# Patient Record
Sex: Female | Born: 1970
Health system: Southern US, Community
[De-identification: ages and names within clinical notes are randomized; demographics above are authoritative.]

## PROBLEM LIST (undated history)

## (undated) DIAGNOSIS — R42 Dizziness and giddiness: Secondary | ICD-10-CM

## (undated) DIAGNOSIS — D649 Anemia, unspecified: Secondary | ICD-10-CM

## (undated) DIAGNOSIS — J189 Pneumonia, unspecified organism: Secondary | ICD-10-CM

## (undated) DIAGNOSIS — R519 Headache, unspecified: Secondary | ICD-10-CM

## (undated) DIAGNOSIS — N941 Unspecified dyspareunia: Secondary | ICD-10-CM

## (undated) DIAGNOSIS — Z2239 Carrier of other specified bacterial diseases: Secondary | ICD-10-CM

## (undated) DIAGNOSIS — N898 Other specified noninflammatory disorders of vagina: Secondary | ICD-10-CM

## (undated) DIAGNOSIS — F329 Major depressive disorder, single episode, unspecified: Secondary | ICD-10-CM

## (undated) DIAGNOSIS — M791 Myalgia, unspecified site: Secondary | ICD-10-CM

## (undated) DIAGNOSIS — Z8659 Personal history of other mental and behavioral disorders: Secondary | ICD-10-CM

## (undated) DIAGNOSIS — F32A Depression, unspecified: Secondary | ICD-10-CM

## (undated) HISTORY — PX: TUBAL LIGATION: SHX77

## (undated) HISTORY — PX: WISDOM TOOTH EXTRACTION: SHX21

## (undated) HISTORY — DX: Unspecified dyspareunia: N94.10

## (undated) HISTORY — PX: ABDOMINAL HYSTERECTOMY: SHX81

## (undated) HISTORY — PX: DILATION AND CURETTAGE OF UTERUS: SHX78

## (undated) HISTORY — DX: Other specified noninflammatory disorders of vagina: N89.8

## (undated) HISTORY — PX: KNEE ARTHROSCOPY: SUR90

## (undated) HISTORY — DX: Myalgia, unspecified site: M79.10

## (undated) HISTORY — DX: Personal history of other mental and behavioral disorders: Z86.59

## (undated) HISTORY — DX: Carrier of other specified bacterial diseases: Z22.39

## (undated) HISTORY — PX: SHOULDER ARTHROSCOPY: SHX128

## (undated) HISTORY — DX: Dizziness and giddiness: R42

## (undated) HISTORY — PX: OTHER SURGICAL HISTORY: SHX169

---
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
## 2010-09-15 ENCOUNTER — Encounter: Admission: RE | Admit: 2010-09-15 | Discharge: 2010-09-15 | Payer: Self-pay | Admitting: Family Medicine

## 2013-09-07 ENCOUNTER — Other Ambulatory Visit (HOSPITAL_COMMUNITY)
Admission: RE | Admit: 2013-09-07 | Discharge: 2013-09-07 | Disposition: A | Discharge status: Home / Self Care | Payer: BC Managed Care – PPO | Source: Ambulatory Visit | Attending: Obstetrics & Gynecology | Admitting: Obstetrics & Gynecology

## 2013-09-07 ENCOUNTER — Other Ambulatory Visit: Payer: Self-pay | Admitting: Obstetrics & Gynecology

## 2013-09-07 DIAGNOSIS — Z1151 Encounter for screening for human papillomavirus (HPV): Secondary | ICD-10-CM | POA: Insufficient documentation

## 2013-09-07 DIAGNOSIS — Z01419 Encounter for gynecological examination (general) (routine) without abnormal findings: Secondary | ICD-10-CM | POA: Insufficient documentation

## 2015-06-21 ENCOUNTER — Other Ambulatory Visit: Payer: Self-pay | Admitting: Obstetrics and Gynecology

## 2015-10-01 ENCOUNTER — Encounter (HOSPITAL_COMMUNITY): Payer: Self-pay | Admitting: *Deleted

## 2015-10-04 ENCOUNTER — Other Ambulatory Visit (HOSPITAL_COMMUNITY): Payer: Self-pay | Admitting: Obstetrics and Gynecology

## 2015-10-04 NOTE — H&P (Signed)
Kim Gonzales is a 44 y.o. female  P: 3-0-0-3 presents for endometrial ablation because of menorrhagia.  Over the past 4 years the patient has had worsening of her menstrual flow that occurs for 6 days and requires the change of a pad plus tampon 6 times a day.  Additionally she has cramping that she rates as 10/10 on a 10 point pain scale that decreases to 7/10 with Ibuprofen 800 mg.  She denies inter-menstrual bleeding, changes in bowel function or post coital bleeding. However, she has occasional urge incontinence and leaking of urine with cough.   Her recent TSH is normal and previous endometrial biopsy showed degenerating secretory type endometrium with stromal breakdown.  A pelvic ultrasound in April 2016 showed a retroverted uterus: 8.46 x 6.39 x 5.36 cm, endometrium-3.4 mm a right lower uterine segment pedunculated fibroid 2.6 x 1.7 x 1.8 cm; left ovary-3.34 x 2.16 x 1.82 cm and right ovary- 3.45 x 2.29 x 1.74 cm;  uterine length from fundus to external os-9.0 cm.  Given the chronic and disruptive nature of her symptoms, the patient has declined medical management and wishes to proceed with hysteroscopy, dilatation, curettage and endometrial ablation.   Past Medical History  OB History: G: 3; P: 3-0-0-3; C-section: 1999,  2001  and 2005  GYN History: menarche: 43 YO    LMP: 10/03/2015     Contracepton bilateral tubal ligation  The patient denies history of sexually transmitted disease.  Denies history of abnormal PAP smear.   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 ferrous sulfate 325 (65 FE) MG tablet Take 325 mg by mouth daily with breakfast.  . ibuprofen (ADVIL,MOTRIN) 200 MG tablet Take 800 mg by mouth every 6 (six) hours as needed for mild pain.   No facility-administered encounter medications on file as of 10/04/2015.    No Known Allergies   Denies sensitivity to peanuts, shellfish, soy, latex or adhesives.   ROS: Admits to glasses, bilateral knee swelling occasionally and lower back pain. but denies headache, vision changes, nasal congestion, dysphagia, tinnitus, dizziness, hoarseness, cough,  chest pain, shortness of breath, nausea, vomiting, diarrhea,constipation,  urinary frequency, urgency  dysuria, hematuria, vaginitis symptoms, pelvic pain, swelling of joints,easy bruising,  myalgias, arthralgias, skin rashes, unexplained weight loss and except as is mentioned in the history of present illness, patient's review of systems is otherwise negative.     Physical Exam  Bp: 106/60   P:  68   R: 20  Temperature: 98 degrees Weight: 161 lbs.  Height: 5' 7.5"  Neck: supple without masses or thyromegaly Lungs: clear to auscultation Heart: regular rate and rhythm Abdomen: soft, non-tender and no organomegaly Pelvic:EGBUS- wnl; vagina-normal rugae; uterus-normal size, cervix without lesions or motion tenderness; adnexae-no tenderness or masses Extremities:  no clubbing, cyanosis or edema   Assesment: Menorrhagia   Disposition:  Reviewed with the patient the indications for her procedures  the risks of surgery to include, but not limited to: reaction to anesthesia, damage to adjacent organs, infection  and excessive bleeding. The patient verbalized understanding of these risks and has consented to proceed with Hysteroscopy, Dilatation, Curettage and Hydro-Thermal Ablation at [facility] [place].   CSN# [id number]   Angela Vazguez J. Florene Glen, PA-C  for Dr. Franklyn Lor.  Dillard

## 2015-10-10 ENCOUNTER — Encounter (HOSPITAL_COMMUNITY)
Admission: RE | Disposition: A | Discharge status: Home / Self Care | Payer: Self-pay | Source: Ambulatory Visit | Attending: Obstetrics and Gynecology

## 2015-10-10 ENCOUNTER — Encounter (HOSPITAL_COMMUNITY): Payer: Self-pay | Admitting: Anesthesiology

## 2015-10-10 ENCOUNTER — Ambulatory Visit (HOSPITAL_COMMUNITY): Payer: BLUE CROSS/BLUE SHIELD | Admitting: Anesthesiology

## 2015-10-10 ENCOUNTER — Ambulatory Visit (HOSPITAL_COMMUNITY)
Admission: RE | Admit: 2015-10-10 | Discharge: 2015-10-10 | Disposition: A | Discharge status: Home / Self Care | Payer: BLUE CROSS/BLUE SHIELD | Source: Ambulatory Visit | Attending: Obstetrics and Gynecology | Admitting: Obstetrics and Gynecology

## 2015-10-10 DIAGNOSIS — D649 Anemia, unspecified: Secondary | ICD-10-CM | POA: Insufficient documentation

## 2015-10-10 DIAGNOSIS — N920 Excessive and frequent menstruation with regular cycle: Secondary | ICD-10-CM | POA: Insufficient documentation

## 2015-10-10 HISTORY — PX: DILITATION & CURRETTAGE/HYSTROSCOPY WITH HYDROTHERMAL ABLATION: SHX5570

## 2015-10-10 LAB — CBC
HCT: 30.1 % — ABNORMAL LOW (ref 36.0–46.0)
Hemoglobin: 9.5 g/dL — ABNORMAL LOW (ref 12.0–15.0)
MCH: 24.7 pg — AB (ref 26.0–34.0)
MCHC: 31.6 g/dL (ref 30.0–36.0)
MCV: 78.4 fL (ref 78.0–100.0)
PLATELETS: 288 10*3/uL (ref 150–400)
RBC: 3.84 MIL/uL — ABNORMAL LOW (ref 3.87–5.11)
RDW: 15.5 % (ref 11.5–15.5)
WBC: 5.5 10*3/uL (ref 4.0–10.5)

## 2015-10-10 LAB — PREGNANCY, URINE: Preg Test, Ur: NEGATIVE

## 2015-10-10 SURGERY — DILATATION & CURETTAGE/HYSTEROSCOPY WITH HYDROTHERMAL ABLATION
Anesthesia: General

## 2015-10-10 MED ORDER — LIDOCAINE HCL (CARDIAC) 20 MG/ML IV SOLN
INTRAVENOUS | Status: DC | PRN
  Administered 2015-10-10: 50 mg via INTRAVENOUS

## 2015-10-10 MED ORDER — SODIUM CHLORIDE 0.9 % IR SOLN
Status: DC | PRN
  Administered 2015-10-10: 3000 mL

## 2015-10-10 MED ORDER — KETOROLAC TROMETHAMINE 30 MG/ML IJ SOLN
INTRAMUSCULAR | Status: AC
  Filled 2015-10-10: qty 1

## 2015-10-10 MED ORDER — MIDAZOLAM HCL 5 MG/5ML IJ SOLN
INTRAMUSCULAR | Status: DC | PRN
  Administered 2015-10-10: 2 mg via INTRAVENOUS

## 2015-10-10 MED ORDER — ONDANSETRON HCL 4 MG/2ML IJ SOLN
INTRAMUSCULAR | Status: AC
  Filled 2015-10-10: qty 2

## 2015-10-10 MED ORDER — PROPOFOL 10 MG/ML IV BOLUS
INTRAVENOUS | Status: AC
  Filled 2015-10-10: qty 20

## 2015-10-10 MED ORDER — SCOPOLAMINE 1 MG/3DAYS TD PT72
MEDICATED_PATCH | TRANSDERMAL | Status: AC
  Administered 2015-10-10: 1.5 mg via TRANSDERMAL
  Filled 2015-10-10: qty 1

## 2015-10-10 MED ORDER — HYDROCODONE-ACETAMINOPHEN 7.5-325 MG PO TABS: 1.0000 | ORAL_TABLET | Freq: Once | ORAL | Status: DC | PRN

## 2015-10-10 MED ORDER — SILVER NITRATE-POT NITRATE 75-25 % EX MISC
CUTANEOUS | Status: DC | PRN
  Administered 2015-10-10: 3

## 2015-10-10 MED ORDER — DEXAMETHASONE SODIUM PHOSPHATE 4 MG/ML IJ SOLN
INTRAMUSCULAR | Status: DC | PRN
  Administered 2015-10-10: 4 mg via INTRAVENOUS

## 2015-10-10 MED ORDER — FENTANYL CITRATE (PF) 100 MCG/2ML IJ SOLN
INTRAMUSCULAR | Status: AC
  Filled 2015-10-10: qty 2

## 2015-10-10 MED ORDER — LIDOCAINE HCL 2 % IJ SOLN
INTRAMUSCULAR | Status: DC | PRN
  Administered 2015-10-10: 10 mL

## 2015-10-10 MED ORDER — PROPOFOL 10 MG/ML IV BOLUS
INTRAVENOUS | Status: DC | PRN
  Administered 2015-10-10: 170 mg via INTRAVENOUS

## 2015-10-10 MED ORDER — DOXYCYCLINE HYCLATE 50 MG PO CAPS: ORAL_CAPSULE | ORAL | Status: DC

## 2015-10-10 MED ORDER — FENTANYL CITRATE (PF) 100 MCG/2ML IJ SOLN
INTRAMUSCULAR | Status: DC | PRN
  Administered 2015-10-10 (×3): 50 ug via INTRAVENOUS

## 2015-10-10 MED ORDER — LIDOCAINE HCL (CARDIAC) 20 MG/ML IV SOLN
INTRAVENOUS | Status: AC
  Filled 2015-10-10: qty 5

## 2015-10-10 MED ORDER — LIDOCAINE HCL 2 % IJ SOLN
INTRAMUSCULAR | Status: AC
  Filled 2015-10-10: qty 20

## 2015-10-10 MED ORDER — METOCLOPRAMIDE HCL 5 MG/ML IJ SOLN: 10.0000 mg | Freq: Once | INTRAMUSCULAR | Status: DC | PRN

## 2015-10-10 MED ORDER — FENTANYL CITRATE (PF) 100 MCG/2ML IJ SOLN
25.0000 ug | INTRAMUSCULAR | Status: DC | PRN
  Administered 2015-10-10 (×2): 50 ug via INTRAVENOUS

## 2015-10-10 MED ORDER — SILVER SULFADIAZINE 1 % EX CREA
TOPICAL_CREAM | CUTANEOUS | Status: DC | PRN
  Administered 2015-10-10: 1 via TOPICAL

## 2015-10-10 MED ORDER — BUPIVACAINE-EPINEPHRINE (PF) 0.25% -1:200000 IJ SOLN
INTRAMUSCULAR | Status: AC
  Filled 2015-10-10: qty 30

## 2015-10-10 MED ORDER — SCOPOLAMINE 1 MG/3DAYS TD PT72
1.0000 | MEDICATED_PATCH | Freq: Once | TRANSDERMAL | Status: DC
  Administered 2015-10-10: 1.5 mg via TRANSDERMAL

## 2015-10-10 MED ORDER — MEPERIDINE HCL 25 MG/ML IJ SOLN: 6.2500 mg | INTRAMUSCULAR | Status: DC | PRN

## 2015-10-10 MED ORDER — MIDAZOLAM HCL 2 MG/2ML IJ SOLN
INTRAMUSCULAR | Status: AC
  Filled 2015-10-10: qty 2

## 2015-10-10 MED ORDER — KETOROLAC TROMETHAMINE 30 MG/ML IJ SOLN
INTRAMUSCULAR | Status: DC | PRN
  Administered 2015-10-10: 30 mg via INTRAVENOUS

## 2015-10-10 MED ORDER — FENTANYL CITRATE (PF) 250 MCG/5ML IJ SOLN
INTRAMUSCULAR | Status: AC
  Filled 2015-10-10: qty 5

## 2015-10-10 MED ORDER — ONDANSETRON HCL 4 MG/2ML IJ SOLN
INTRAMUSCULAR | Status: DC | PRN
  Administered 2015-10-10: 4 mg via INTRAVENOUS

## 2015-10-10 MED ORDER — SILVER SULFADIAZINE 1 % EX CREA
TOPICAL_CREAM | CUTANEOUS | Status: AC
  Filled 2015-10-10: qty 85

## 2015-10-10 MED ORDER — HYDROCODONE-ACETAMINOPHEN 5-325 MG PO TABS: 1.0000 | ORAL_TABLET | Freq: Four times a day (QID) | ORAL | Status: DC | PRN

## 2015-10-10 MED ORDER — LACTATED RINGERS IV SOLN
INTRAVENOUS | Status: DC
  Administered 2015-10-10 (×2): via INTRAVENOUS

## 2015-10-10 SURGICAL SUPPLY — 16 items
CANISTER SUCT 3000ML (MISCELLANEOUS) ×3 IMPLANT
CATH ROBINSON RED A/P 16FR (CATHETERS) ×3 IMPLANT
CLOTH BEACON ORANGE TIMEOUT ST (SAFETY) ×3 IMPLANT
CONTAINER PREFILL 10% NBF 60ML (FORM) ×6 IMPLANT
DILATOR CANAL MILEX (MISCELLANEOUS) IMPLANT
GAUZE SPONGE 4X4 16PLY XRAY LF (GAUZE/BANDAGES/DRESSINGS) ×3 IMPLANT
GLOVE BIO SURGEON STRL SZ 6.5 (GLOVE) ×2 IMPLANT
GLOVE BIO SURGEONS STRL SZ 6.5 (GLOVE) ×1
GLOVE BIOGEL PI IND STRL 7.0 (GLOVE) ×2 IMPLANT
GLOVE BIOGEL PI INDICATOR 7.0 (GLOVE) ×4
GOWN STRL REUS W/TWL LRG LVL3 (GOWN DISPOSABLE) ×6 IMPLANT
PACK VAGINAL MINOR WOMEN LF (CUSTOM PROCEDURE TRAY) ×3 IMPLANT
PAD OB MATERNITY 4.3X12.25 (PERSONAL CARE ITEMS) ×3 IMPLANT
SET GENESYS HTA PROCERVA (MISCELLANEOUS) ×3 IMPLANT
TOWEL OR 17X24 6PK STRL BLUE (TOWEL DISPOSABLE) ×6 IMPLANT
WATER STERILE IRR 1000ML POUR (IV SOLUTION) ×3 IMPLANT

## 2015-10-10 NOTE — Interval H&P Note (Signed)
History and Physical Interval Note:  10/10/2015 1:56 PM  Kim Gonzales  has presented today for surgery, with the diagnosis of Menorrhagia  The various methods of treatment have been discussed with the patient and family. After consideration of risks, benefits and other options for treatment, the patient has consented to  Procedure(s): DILATATION & CURETTAGE/HYSTEROSCOPY WITH HYDROTHERMAL ABLATION (N/A) as a surgical intervention .  The patient's history has been reviewed, patient examined, no change in status, stable for surgery.  I have reviewed the patient's chart and labs.  Questions were answered to the patient's satisfaction.     [facility]

## 2015-10-10 NOTE — Anesthesia Procedure Notes (Signed)
Procedure Name: LMA Insertion Date/Time: 10/10/2015 2:06 PM Performed by: Talbot Grumbling Pre-anesthesia Checklist: Patient identified, Emergency Drugs available, Suction available and Patient being monitored Patient Re-evaluated:Patient Re-evaluated prior to inductionOxygen Delivery Method: Circle system utilized Preoxygenation: Pre-oxygenation with 100% oxygen Intubation Type: IV induction Ventilation: Mask ventilation without difficulty LMA: LMA inserted LMA Size: 4.0 Number of attempts: 1 Placement Confirmation: positive ETCO2 and breath sounds checked- equal and bilateral Tube secured with: Tape Dental Injury: Teeth and Oropharynx as per pre-operative assessment

## 2015-10-10 NOTE — Anesthesia Postprocedure Evaluation (Signed)
Anesthesia Post Note  Patient: Kim Gonzales  Procedure(s) Performed: Procedure(s) (LRB): DILATATION & CURETTAGE/HYSTEROSCOPY WITH HYDROTHERMAL ABLATION (N/A)  Patient location during evaluation: PACU Anesthesia Type: MAC Level of consciousness: awake and alert Pain management: pain level controlled Vital Signs Assessment: post-procedure vital signs reviewed and stable Respiratory status: spontaneous breathing, nonlabored ventilation, respiratory function stable and patient connected to nasal cannula oxygen Cardiovascular status: stable and blood pressure returned to baseline Anesthetic complications: no    Last Vitals:  Filed Vitals:   10/10/15 1238 10/10/15 1459  BP: 121/78   Pulse: 63   Temp: 36.5 C 36.7 C  Resp: 20     Last Pain: There were no vitals filed for this visit.               Evalene Vath

## 2015-10-10 NOTE — H&P (View-Only) (Signed)
Kim Gonzales is a 44 y.o. female  P: 3-0-0-3 presents for endometrial ablation because of menorrhagia.  Over the past 4 years the patient has had worsening of her menstrual flow that occurs for 6 days and requires the change of a pad plus tampon 6 times a day.  Additionally she has cramping that she rates as 10/10 on a 10 point pain scale that decreases to 7/10 with Ibuprofen 800 mg.  She denies inter-menstrual bleeding, changes in bowel function or post coital bleeding. However, she has occasional urge incontinence and leaking of urine with cough.   Her recent TSH is normal and previous endometrial biopsy showed degenerating secretory type endometrium with stromal breakdown.  A pelvic ultrasound in April 2016 showed a retroverted uterus: 8.46 x 6.39 x 5.36 cm, endometrium-3.4 mm a right lower uterine segment pedunculated fibroid 2.6 x 1.7 x 1.8 cm; left ovary-3.34 x 2.16 x 1.82 cm and right ovary- 3.45 x 2.29 x 1.74 cm;  uterine length from fundus to external os-9.0 cm.  Given the chronic and disruptive nature of her symptoms, the patient has declined medical management and wishes to proceed with hysteroscopy, dilatation, curettage and endometrial ablation.   Past Medical History  OB History: G: 3; P: 3-0-0-3; C-section: 1999,  2001  and 2005  GYN History: menarche: 44 YO    LMP: 10/03/2015     Contracepton bilateral tubal ligation  The patient denies history of sexually transmitted disease.  Denies history of abnormal PAP smear.   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 ferrous sulfate 325 (65 FE) MG tablet Take 325 mg by mouth daily with breakfast.  . ibuprofen (ADVIL,MOTRIN) 200 MG tablet Take 800 mg by mouth every 6 (six) hours as needed for mild pain.   No facility-administered encounter medications on file as of 10/04/2015.    No Known Allergies   Denies sensitivity to peanuts, shellfish, soy, latex or adhesives.   ROS: Admits to glasses, bilateral knee swelling occasionally and lower back pain. but denies headache, vision changes, nasal congestion, dysphagia, tinnitus, dizziness, hoarseness, cough,  chest pain, shortness of breath, nausea, vomiting, diarrhea,constipation,  urinary frequency, urgency  dysuria, hematuria, vaginitis symptoms, pelvic pain, swelling of joints,easy bruising,  myalgias, arthralgias, skin rashes, unexplained weight loss and except as is mentioned in the history of present illness, patient's review of systems is otherwise negative.     Physical Exam  Bp: 106/60   P:  68   R: 20  Temperature: 98 degrees Weight: 161 lbs.  Height: 5' 7.5"  Neck: supple without masses or thyromegaly Lungs: clear to auscultation Heart: regular rate and rhythm Abdomen: soft, non-tender and no organomegaly Pelvic:EGBUS- wnl; vagina-normal rugae; uterus-normal size, cervix without lesions or motion tenderness; adnexae-no tenderness or masses Extremities:  no clubbing, cyanosis or edema   Assesment: Menorrhagia   Disposition:  Reviewed with the patient the indications for her procedures  the risks of surgery to include, but not limited to: reaction to anesthesia, damage to adjacent organs, infection  and excessive bleeding. The patient verbalized understanding of these risks and has consented to proceed with Hysteroscopy, Dilatation, Curettage and Hydro-Thermal Ablation at [facility] [place].   CSN# [id number]   Cloteal Isaacson J. Florene Glen, PA-C  for Dr. Franklyn Lor.  Dillard

## 2015-10-10 NOTE — Op Note (Signed)
Preop Diagnosis: Menorrhagia   Postop Diagnosis: Menorrhagia   Procedure: DILATATION & CURETTAGE/HYSTEROSCOPY WITH HYDROTHERMAL ABLATION   Anesthesia: Choice   Anesthesiologist: No responsible provider has been recorded for the case.   Attending: Crawford Givens, MD   Assistant:  Findings: small retroverted cavity no polyps or fibroids seen  Pathology: [facility] - Closed  Fluids:  UOP:  EBL: minimal  Complications: there was a small leak of fluid so the procedure had to be discontinued.    Procedure: The patient was taken to the operating room after risks benefits and alternatives were discussed with patient, the patient verbalized understanding and consent signed and witnessed. The patient was placed under general anesthesia and prepped and draped in normal sterile fashion. A bivalve speculum was placed in the patient's vagina and the anterior lip of the cervix was grasped with a single-tooth tenaculum. The cervix was dilated for passage of the hysteroscope. The uterus sounded to 8.  The hysteroscope was introduced into the uterine cavity with findings as noted above. A curettage was performed and currettings sent to pathology. The hysteroscope was reintroduced and hydrothermal ablation was performed.  After 4 minutes 7 cc fluid leaked out.  i placed another tenaculum, however the fluid continued to leak. The case was stopped.  A layer of silvadene was used to coat the vagina.   . The tenaculum and bivalve speculum were removed and there was good hemostasis at the tenaculum sites. Sponge lap and needle count was correct. The patient tolerated procedure well and was returned to the recovery room in good condition.

## 2015-10-10 NOTE — Transfer of Care (Signed)
Immediate Anesthesia Transfer of Care Note  Patient: Kim Gonzales  Procedure(s) Performed: Procedure(s): DILATATION & CURETTAGE/HYSTEROSCOPY WITH HYDROTHERMAL ABLATION (N/A)  Patient Location: PACU  Anesthesia Type:General  Level of Consciousness: sedated  Airway & Oxygen Therapy: Patient Spontanous Breathing and Patient connected to nasal cannula oxygen  Post-op Assessment: Report given to RN  Post vital signs: Reviewed  Last Vitals:  Filed Vitals:   10/10/15 1238  BP: 121/78  Pulse: 63  Temp: 36.5 C  Resp: 20    Complications: No apparent anesthesia complications

## 2015-10-10 NOTE — Discharge Instructions (Signed)

## 2015-10-10 NOTE — OR Nursing (Signed)
Dr.Dillard noted warm fluid present in vaginal canal. Silvadene applied by Dr.Dillard.

## 2015-10-10 NOTE — Anesthesia Preprocedure Evaluation (Addendum)
Anesthesia Evaluation  Patient identified by MRN, date of birth, ID band Patient awake    Reviewed: Allergy & Precautions, NPO status , Patient's Chart, lab work & pertinent test results  Airway Mallampati: II   Neck ROM: Full    Dental  (+) Dental Advisory Given   Pulmonary neg pulmonary ROS,    breath sounds clear to auscultation       Cardiovascular negative cardio ROS   Rhythm:Regular     Neuro/Psych negative neurological ROS  negative psych ROS   GI/Hepatic negative GI ROS, Neg liver ROS,   Endo/Other  negative endocrine ROS  Renal/GU negative Renal ROS     Musculoskeletal   Abdominal   Peds  Hematology  (+) anemia , 9/30   Anesthesia Other Findings   Reproductive/Obstetrics Menorrhagia                          Anesthesia Physical Anesthesia Plan  ASA: II  Anesthesia Plan: General   Post-op Pain Management:    Induction: Intravenous  Airway Management Planned: LMA  Additional Equipment:   Intra-op Plan:   Post-operative Plan: Extubation in OR  Informed Consent: I have reviewed the patients History and Physical, chart, labs and discussed the procedure including the risks, benefits and alternatives for the proposed anesthesia with the patient or authorized representative who has indicated his/her understanding and acceptance.   Dental advisory given  Plan Discussed with: CRNA, Anesthesiologist and Surgeon  Anesthesia Plan Comments:         Anesthesia Quick Evaluation

## 2015-10-11 ENCOUNTER — Encounter (HOSPITAL_COMMUNITY): Payer: Self-pay | Admitting: Obstetrics and Gynecology

## 2016-03-03 ENCOUNTER — Emergency Department (HOSPITAL_BASED_OUTPATIENT_CLINIC_OR_DEPARTMENT_OTHER): Payer: BLUE CROSS/BLUE SHIELD

## 2016-03-03 ENCOUNTER — Encounter (HOSPITAL_BASED_OUTPATIENT_CLINIC_OR_DEPARTMENT_OTHER): Payer: Self-pay | Admitting: *Deleted

## 2016-03-03 ENCOUNTER — Inpatient Hospital Stay (HOSPITAL_BASED_OUTPATIENT_CLINIC_OR_DEPARTMENT_OTHER)
Admission: EM | Admit: 2016-03-03 | Discharge: 2016-03-07 | DRG: 872 | Disposition: A | Discharge status: Home / Self Care | Payer: BLUE CROSS/BLUE SHIELD | Attending: Internal Medicine | Admitting: Internal Medicine

## 2016-03-03 DIAGNOSIS — B9620 Unspecified Escherichia coli [E. coli] as the cause of diseases classified elsewhere: Secondary | ICD-10-CM | POA: Diagnosis present

## 2016-03-03 DIAGNOSIS — R5081 Fever presenting with conditions classified elsewhere: Secondary | ICD-10-CM | POA: Diagnosis present

## 2016-03-03 DIAGNOSIS — R7989 Other specified abnormal findings of blood chemistry: Secondary | ICD-10-CM | POA: Diagnosis present

## 2016-03-03 DIAGNOSIS — D509 Iron deficiency anemia, unspecified: Secondary | ICD-10-CM

## 2016-03-03 DIAGNOSIS — D72819 Decreased white blood cell count, unspecified: Secondary | ICD-10-CM

## 2016-03-03 DIAGNOSIS — N390 Urinary tract infection, site not specified: Secondary | ICD-10-CM | POA: Diagnosis present

## 2016-03-03 DIAGNOSIS — D759 Disease of blood and blood-forming organs, unspecified: Secondary | ICD-10-CM | POA: Diagnosis present

## 2016-03-03 DIAGNOSIS — W57XXXA Bitten or stung by nonvenomous insect and other nonvenomous arthropods, initial encounter: Secondary | ICD-10-CM | POA: Diagnosis present

## 2016-03-03 DIAGNOSIS — R509 Fever, unspecified: Secondary | ICD-10-CM | POA: Insufficient documentation

## 2016-03-03 DIAGNOSIS — Z79899 Other long term (current) drug therapy: Secondary | ICD-10-CM

## 2016-03-03 DIAGNOSIS — K5900 Constipation, unspecified: Secondary | ICD-10-CM | POA: Diagnosis present

## 2016-03-03 DIAGNOSIS — N12 Tubulo-interstitial nephritis, not specified as acute or chronic: Secondary | ICD-10-CM | POA: Diagnosis present

## 2016-03-03 DIAGNOSIS — D689 Coagulation defect, unspecified: Secondary | ICD-10-CM | POA: Diagnosis present

## 2016-03-03 DIAGNOSIS — R51 Headache: Secondary | ICD-10-CM | POA: Diagnosis present

## 2016-03-03 DIAGNOSIS — R945 Abnormal results of liver function studies: Secondary | ICD-10-CM | POA: Insufficient documentation

## 2016-03-03 DIAGNOSIS — A419 Sepsis, unspecified organism: Secondary | ICD-10-CM

## 2016-03-03 DIAGNOSIS — I959 Hypotension, unspecified: Secondary | ICD-10-CM | POA: Diagnosis present

## 2016-03-03 DIAGNOSIS — N920 Excessive and frequent menstruation with regular cycle: Secondary | ICD-10-CM | POA: Diagnosis present

## 2016-03-03 DIAGNOSIS — A7740 Ehrlichiosis, unspecified: Secondary | ICD-10-CM | POA: Diagnosis present

## 2016-03-03 DIAGNOSIS — E871 Hypo-osmolality and hyponatremia: Secondary | ICD-10-CM | POA: Diagnosis not present

## 2016-03-03 DIAGNOSIS — D702 Other drug-induced agranulocytosis: Secondary | ICD-10-CM | POA: Diagnosis present

## 2016-03-03 DIAGNOSIS — D709 Neutropenia, unspecified: Secondary | ICD-10-CM

## 2016-03-03 HISTORY — DX: Anemia, unspecified: D64.9

## 2016-03-03 LAB — COMPREHENSIVE METABOLIC PANEL
ALT: 35 U/L (ref 14–54)
AST: 44 U/L — ABNORMAL HIGH (ref 15–41)
Albumin: 4.4 g/dL (ref 3.5–5.0)
Alkaline Phosphatase: 38 U/L (ref 38–126)
Anion gap: 5 (ref 5–15)
BUN: 8 mg/dL (ref 6–20)
CO2: 24 mmol/L (ref 22–32)
Calcium: 9.1 mg/dL (ref 8.9–10.3)
Chloride: 105 mmol/L (ref 101–111)
Creatinine, Ser: 1.03 mg/dL — ABNORMAL HIGH (ref 0.44–1.00)
GFR calc Af Amer: >60 mL/min (ref >60)
GFR calc non Af Amer: >60 mL/min (ref >60)
Glucose, Bld: 103 mg/dL — ABNORMAL HIGH (ref 65–99)
Potassium: 3.6 mmol/L (ref 3.5–5.1)
Sodium: 134 mmol/L — ABNORMAL LOW (ref 135–145)
Total Bilirubin: 0.3 mg/dL (ref 0.3–1.2)
Total Protein: 7.6 g/dL (ref 6.5–8.1)

## 2016-03-03 LAB — URINE MICROSCOPIC-ADD ON

## 2016-03-03 LAB — LIPASE, BLOOD: Lipase: 43 U/L (ref 11–51)

## 2016-03-03 LAB — URINALYSIS, ROUTINE W REFLEX MICROSCOPIC
Bilirubin Urine: NEGATIVE
GLUCOSE, UA: NEGATIVE mg/dL
Ketones, ur: NEGATIVE mg/dL
Leukocytes, UA: NEGATIVE
Nitrite: NEGATIVE
Protein, ur: NEGATIVE mg/dL
Specific Gravity, Urine: 1.011 (ref 1.005–1.030)
pH: 6 (ref 5.0–8.0)

## 2016-03-03 LAB — CBC WITH DIFFERENTIAL/PLATELET
Basophils Absolute: 0 10*3/uL (ref 0.0–0.1)
Basophils Relative: 0 %
Eosinophils Absolute: 0 10*3/uL (ref 0.0–0.7)
Eosinophils Relative: 0 %
HCT: 39.5 % (ref 36.0–46.0)
HEMOGLOBIN: 13.2 g/dL (ref 12.0–15.0)
LYMPHS ABS: 0.3 10*3/uL — AB (ref 0.7–4.0)
LYMPHS PCT: 17 %
MCH: 26.6 pg (ref 26.0–34.0)
MCHC: 33.4 g/dL (ref 30.0–36.0)
MCV: 79.6 fL (ref 78.0–100.0)
Monocytes Absolute: 0.1 10*3/uL (ref 0.1–1.0)
Monocytes Relative: 7 %
NEUTROS PCT: 76 %
Neutro Abs: 1.5 10*3/uL — ABNORMAL LOW (ref 1.7–7.7)
Platelets: 172 10*3/uL (ref 150–400)
RBC: 4.96 MIL/uL (ref 3.87–5.11)
RDW: 15.1 % (ref 11.5–15.5)
WBC: 2 10*3/uL — AB (ref 4.0–10.5)

## 2016-03-03 LAB — LACTIC ACID, PLASMA
LACTIC ACID, VENOUS: 0.8 mmol/L (ref 0.5–2.0)
LACTIC ACID, VENOUS: 2.3 mmol/L — AB (ref 0.5–2.0)

## 2016-03-03 LAB — PROCALCITONIN: PROCALCITONIN: 0.16 ng/mL

## 2016-03-03 LAB — PREGNANCY, URINE: Preg Test, Ur: NEGATIVE

## 2016-03-03 LAB — PROTIME-INR
INR: 1.2 (ref 0.00–1.49)
PROTHROMBIN TIME: 15.3 s — AB (ref 11.6–15.2)

## 2016-03-03 LAB — I-STAT CG4 LACTIC ACID, ED: Lactic Acid, Venous: 1.1 mmol/L (ref 0.5–2.0)

## 2016-03-03 LAB — APTT: aPTT: 31 seconds (ref 24–37)

## 2016-03-03 MED ORDER — ACETAMINOPHEN 650 MG RE SUPP
650.0000 mg | Freq: Four times a day (QID) | RECTAL | Status: DC | PRN
Start: 2016-03-03 — End: 2016-03-07

## 2016-03-03 MED ORDER — HYDROCODONE-ACETAMINOPHEN 5-325 MG PO TABS
1.0000 | ORAL_TABLET | ORAL | Status: DC | PRN
  Administered 2016-03-03 – 2016-03-04 (×3): 1 via ORAL
  Filled 2016-03-03 (×3): qty 1

## 2016-03-03 MED ORDER — ACETAMINOPHEN 325 MG PO TABS
650.0000 mg | ORAL_TABLET | Freq: Four times a day (QID) | ORAL | Status: DC | PRN
  Administered 2016-03-04 – 2016-03-06 (×6): 650 mg via ORAL
  Filled 2016-03-03 (×9): qty 2

## 2016-03-03 MED ORDER — SODIUM CHLORIDE 0.9 % IV SOLN
500.0000 mg | Freq: Once | INTRAVENOUS | Status: AC
  Administered 2016-03-03: 500 mg via INTRAVENOUS
  Filled 2016-03-03: qty 500

## 2016-03-03 MED ORDER — FERROUS SULFATE 325 (65 FE) MG PO TABS
325.0000 mg | ORAL_TABLET | Freq: Every day | ORAL | Status: DC
  Administered 2016-03-04 – 2016-03-06 (×3): 325 mg via ORAL
  Filled 2016-03-03 (×4): qty 1

## 2016-03-03 MED ORDER — ONDANSETRON HCL 4 MG PO TABS
4.0000 mg | ORAL_TABLET | Freq: Four times a day (QID) | ORAL | Status: DC | PRN
Start: 2016-03-03 — End: 2016-03-07

## 2016-03-03 MED ORDER — SODIUM CHLORIDE 0.9 % IV SOLN
INTRAVENOUS | Status: DC
  Administered 2016-03-03: 14:00:00 via INTRAVENOUS

## 2016-03-03 MED ORDER — POLYETHYLENE GLYCOL 3350 17 G PO PACK: 17.0000 g | PACK | Freq: Every day | ORAL | Status: DC | PRN

## 2016-03-03 MED ORDER — BISACODYL 5 MG PO TBEC: 5.0000 mg | DELAYED_RELEASE_TABLET | Freq: Every day | ORAL | Status: DC | PRN

## 2016-03-03 MED ORDER — SODIUM CHLORIDE 0.9 % IV BOLUS (SEPSIS)
1000.0000 mL | Freq: Once | INTRAVENOUS | Status: AC
  Administered 2016-03-03: 1000 mL via INTRAVENOUS

## 2016-03-03 MED ORDER — SODIUM CHLORIDE 0.9 % IV BOLUS (SEPSIS)
500.0000 mL | Freq: Once | INTRAVENOUS | Status: AC
  Administered 2016-03-03: 500 mL via INTRAVENOUS

## 2016-03-03 MED ORDER — SODIUM CHLORIDE 0.9 % IV SOLN: INTRAVENOUS | Status: DC

## 2016-03-03 MED ORDER — ACETAMINOPHEN 500 MG PO TABS
1000.0000 mg | ORAL_TABLET | Freq: Once | ORAL | Status: AC
  Administered 2016-03-03: 1000 mg via ORAL
  Filled 2016-03-03: qty 2

## 2016-03-03 MED ORDER — ONDANSETRON HCL 4 MG/2ML IJ SOLN
4.0000 mg | Freq: Once | INTRAMUSCULAR | Status: AC
  Administered 2016-03-03: 4 mg via INTRAVENOUS
  Filled 2016-03-03: qty 2

## 2016-03-03 MED ORDER — ONDANSETRON HCL 4 MG/2ML IJ SOLN
4.0000 mg | Freq: Four times a day (QID) | INTRAMUSCULAR | Status: DC | PRN
  Administered 2016-03-04 – 2016-03-05 (×2): 4 mg via INTRAVENOUS
  Filled 2016-03-03 (×2): qty 2

## 2016-03-03 MED ORDER — ENOXAPARIN SODIUM 40 MG/0.4ML ~~LOC~~ SOLN
40.0000 mg | SUBCUTANEOUS | Status: DC
  Administered 2016-03-03 – 2016-03-04 (×2): 40 mg via SUBCUTANEOUS
  Filled 2016-03-03 (×3): qty 0.4

## 2016-03-03 MED ORDER — LEVOFLOXACIN IN D5W 750 MG/150ML IV SOLN
750.0000 mg | INTRAVENOUS | Status: DC
  Administered 2016-03-03: 750 mg via INTRAVENOUS
  Filled 2016-03-03: qty 150

## 2016-03-03 NOTE — H&P (Signed)
History and Physical    Kim Gonzales [id number] DOB: [birth year] DOA: 03/03/2016  Referring Provider: EDP  PCP: Lynne Logan, MD  Outpatient Specialists: None listed  Patient coming from: Home, via [facility]  Chief Complaint: Right flank pain, fevers   HPI: Kim Gonzales is a 45 y.o. female with medical history significant for iron deficiency anemia and menorrhagia status post D&C who presents to the ED at [facility] for evaluation of fevers, chills, and right flank pain despite 10 days of antibiotic therapy for UTI. Patient reports that she generally enjoys good health and was in her usual state until approximately 4 weeks ago when she developed dysuria and increased urinary frequency and urgency. She did not seek medical evaluation until approximately 10 days prior to her admission. At that time, the patient was started on empiric therapy with Keflex for suspected UTI. Culture was obtained and grew Escherichia coli, resistant to penicillins and cephalosporins, and sensitive to Bactrim, fluoroquinolones, and Macrodantin. Therapy was switched to ciprofloxacin at that time. Course was completed with no appreciable improvement in her symptoms and she was placed on Bactrim which she has taken for the past 3 days. Patient reports intermittent and mild pain in the right flank for the past couple weeks, but notes that this intensified significantly in the past 2 days. Right flank and right lower abdominal pain have become severe, and there has been associated chills and subjective fevers for which the patient has taken Tylenol.  ED Course: Upon arrival to the ED at [facility], patient is found to be afebrile, saturating well on room air, and with vital signs stable. Urine was obtained for analysis and notable for many bacteria, negative nitrites, negative leukocytes, moderate hemoglobin, and 0-5 wbc's. CMP features a mild hyponatremia and mild elevation in serum creatinine. CBC is notable for a  leukopenia with WBC of 2000. White count was normal at 5500 in December 2016. Lactic acid was obtained and returns less than 2. Further evaluation was performed with CT of the abdomen and pelvis which is negative for stones, abscess, hydronephrosis, or hydroureter. Blood and urine cultures were obtained. Patient was treated with empiric imipenem-cilastatin, given a 1 L bolus of normal saline, and treated with 1 g of Tylenol 2. Given the apparent failure of outpatient therapy, and concern regarding leukopenia, transferred to [facility] for admission was requested by the EDP and accepted.  The patient is evaluated on the medical-surgical unit at [facility] where she is found to remain afebrile and with vital signs stable. She will be admitted for ongoing evaluation and management of suspected right-sided pyelonephritis.  Review of Systems:  All other systems reviewed and apart from HPI, are negative.  History reviewed. No pertinent past medical history.  Past Surgical History  Procedure Laterality Date  . Knee arthroscopy    . Cesarean section      x3  . Shoulder arthroscopy    . Wisdom tooth extraction    . Tubal ligation      pptl  . Dilitation & currettage/hystroscopy with hydrothermal ablation N/A 10/10/2015    Procedure: DILATATION & CURETTAGE/HYSTEROSCOPY WITH HYDROTHERMAL ABLATION;  Surgeon: Crawford Givens, MD;  Location: [facility];  Service: Gynecology;  Laterality: N/A;     reports that she has never smoked. She does not have any smokeless tobacco history on file. She reports that she does not drink alcohol or use illicit drugs.  No Known Allergies  Family History  Problem Relation Age of Onset  .  Kidney nephrosis Neg Hx      Prior to Admission medications   Medication Sig Start Date End Date Taking? Authorizing Provider  Ciprofloxacin (CIPRO PO) Take by mouth.   Yes Historical Provider, MD  doxycycline (VIBRAMYCIN) 50 MG capsule Take one tablet by mouth twice a day  for 7 days 10/10/15   Crawford Givens, MD  ferrous sulfate 325 (65 FE) MG tablet Take 325 mg by mouth daily with breakfast.    Historical Provider, MD  HYDROcodone-acetaminophen (NORCO/VICODIN) 5-325 MG tablet Take 1 tablet by mouth every 6 (six) hours as needed. 10/10/15   Crawford Givens, MD  ibuprofen (ADVIL,MOTRIN) 200 MG tablet Take 800 mg by mouth every 6 (six) hours as needed for mild pain.    Historical Provider, MD    Physical Exam: Filed Vitals:   03/03/16 1445 03/03/16 1530 03/03/16 1610 03/03/16 1823  BP: 94/57 100/63 120/65 112/52  Pulse: 72 70 69 64  Temp:    98.3 F (36.8 C)  TempSrc:    Oral  Resp:   18 18  Height:    5' 7.5" (1.715 m)  Weight:    71.8 kg (158 lb 4.6 oz)  SpO2: 96% 98% 98% 99%      Constitutional: NAD, calm, comfortable Eyes: PERTLA, lids and conjunctivae normal ENMT: Mucous membranes are moist. Posterior pharynx clear of any exudate or lesions. Normal dentition.  Neck: normal, supple, no masses, no thyromegaly Respiratory: clear to auscultation bilaterally, no wheezing, no crackles. Normal respiratory effort. No accessory muscle use.  Cardiovascular: S1 & S2 heard, regular rate and rhythm, no murmurs / rubs / gallops. No extremity edema. 2+ pedal pulses. No carotid bruits.  Abdomen: No distension, mildly tender at RLQ and right CVA, no rebound or guarding, no masses palpated. Bowel sounds normal.  Musculoskeletal: no clubbing / cyanosis. No joint deformity upper and lower extremities. Normal muscle tone.  Skin: no rashes, lesions, ulcers. No induration Neurologic: CN 2-12 grossly intact. Sensation intact, DTR normal. Strength 5/5 in all 4 limbs.  Psychiatric: Normal judgment and insight. Alert and oriented x 3. Normal mood.     Labs on Admission: I have personally reviewed following labs and imaging studies  CBC:  Recent Labs Lab 03/03/16 1230  WBC 2.0*  NEUTROABS 1.5*  HGB 13.2  HCT 39.5  MCV 79.6  PLT [id number]   Basic Metabolic  Panel:  Recent Labs Lab 03/03/16 1230  NA 134*  K 3.6  CL 105  CO2 24  GLUCOSE 103*  BUN 8  CREATININE 1.03*  CALCIUM 9.1   GFR: Estimated Creatinine Clearance: 67.7 mL/min (by C-G formula based on Cr of 1.03). Liver Function Tests:  Recent Labs Lab 03/03/16 1230  AST 44*  ALT 35  ALKPHOS 38  BILITOT 0.3  PROT 7.6  ALBUMIN 4.4    Recent Labs Lab 03/03/16 1230  LIPASE 43   No results for input(s): AMMONIA in the last 168 hours. Coagulation Profile: No results for input(s): INR, PROTIME in the last 168 hours. Cardiac Enzymes: No results for input(s): CKTOTAL, CKMB, CKMBINDEX, TROPONINI in the last 168 hours. BNP (last 3 results) No results for input(s): PROBNP in the last 8760 hours. HbA1C: No results for input(s): HGBA1C in the last 72 hours. CBG: No results for input(s): GLUCAP in the last 168 hours. Lipid Profile: No results for input(s): CHOL, HDL, LDLCALC, TRIG, CHOLHDL, LDLDIRECT in the last 72 hours. Thyroid Function Tests: No results for input(s): TSH, T4TOTAL, FREET4, T3FREE, THYROIDAB in the last  72 hours. Anemia Panel: No results for input(s): VITAMINB12, FOLATE, FERRITIN, TIBC, IRON, RETICCTPCT in the last 72 hours. Urine analysis:    Component Value Date/Time   COLORURINE YELLOW 03/03/2016 [place] 03/03/2016 1205   LABSPEC 1.011 03/03/2016 1205   PHURINE 6.0 03/03/2016 1205   GLUCOSEU NEGATIVE 03/03/2016 1205   HGBUR MODERATE* 03/03/2016 1205   [place] 03/03/2016 1205   [place] 03/03/2016 1205   PROTEINUR NEGATIVE 03/03/2016 1205   NITRITE NEGATIVE 03/03/2016 1205   LEUKOCYTESUR NEGATIVE 03/03/2016 1205   Sepsis Labs: @LABRCNTIP (procalcitonin:4,lacticidven:4) )No results found for this or any previous visit (from the past 240 hour(s)).   Radiological Exams on Admission: Ct Renal Stone Study  03/03/2016  CLINICAL DATA:  Right flank pain for 1 month EXAM: CT ABDOMEN AND PELVIS WITHOUT CONTRAST  TECHNIQUE: Multidetector CT imaging of the abdomen and pelvis was performed following the standard protocol without IV contrast. COMPARISON:  Pelvic ultrasound 12/28/2014 FINDINGS: Lower chest:  The lung bases are unremarkable. Hepatobiliary: Unenhanced liver shows no biliary ductal dilatation. No calcified gallstones are noted within gallbladder. No CBD dilatation. Pancreas: Unenhanced pancreas is unremarkable. Spleen: Unenhanced spleen is unremarkable. Adrenals/Urinary Tract: No adrenal gland mass. Unenhanced kidneys are symmetrical in size. Nephrolithiasis. No hydronephrosis or hydroureter. No calcified ureteral calculi are noted. No calcified calculi are noted within under distended urinary bladder. Stomach/Bowel: There is no gastric outlet obstruction. The study is limited without IV or oral contrast. No small bowel obstruction. Abundant stool is noted in right colon and transverse colon. Abundant stool is noted within cecum. There is a low lying cecum. Normal appendix clearly visualized in coronal image 36. Moderate stool noted within and descending colon. Moderate stool noted within distal sigmoid colon. Moderate stool and gas noted within rectum. Vascular/Lymphatic: There is no aortic aneurysm. Pelvic phleboliths are noted. No retroperitoneal or mesenteric adenopathy. Reproductive: There is again noted retroverted uterus. There is anterior pedunculated uterine fibroid measures about 2.2 cm. No adnexal masses noted. No pelvic free fluid. Other: No ascites or free air. Musculoskeletal: No destructive bony lesions are noted. Sagittal images of the spine shows significant disc space flattening with vacuum disc phenomenon at L4-L5 level. Mild disc space flattening minimal posterior disc bulge at L3-L4 level. Mild posterior disc bulge at L5-S1 level. No destructive bony lesions are noted within pelvis. IMPRESSION: 1. There is no evidence of nephrolithiasis. No hydronephrosis or hydroureter. No calcified ureteral  calculi. 2. Abundant stool noted in right colon and redundant transverse colon. There is a low lying cecum. Abundant stool noted within cecum. No pericecal inflammation. Normal appendix. 3. Retroverted uterus. Anterior uterine fibroid measures at least 2.2 cm. No adnexal mass. No pelvic free fluid. 4. Degenerative changes lumbar spine. 5. No small bowel obstruction. Electronically Signed   By: Lahoma Crocker M.D.   On: 03/03/2016 13:33    EKG: Not performed. Will obtain as appropriate.   Assessment/Plan  1. Pyelonephritis  - Lower urinary symptoms began 4 wks ago, with fever/chills and right flank pain since 03/02/16 despite therapy with organism-specific abx  - Pt has remained afebrile since arrival (though treated with 1g APAP x2 in ED) and appears non-toxic  - Leukopenia is concerning for severe infection, though other parameters (ie., lactate, vitals) are reassuring  - No complicating features are identified on CT abd/pelvis (03/03/16)  - Was treated with Keflex, Cipro, and Bactrim DS prior to arrival  - Imipenem-cilastatin administered x1 at [facility]  - Continue culture-directed treatment with IV Levaquin  750 mg q24h  - Trend procalcitonin, wbc  - Pain-control prn   2. Leukopenia  - WBC is 2,000 on admission, previously 5,500 in December '16  - Likely secondary to the infectious process  - Remaining cell lines wnl  - Repeat CBC with diff in am   3. Hyponatremia - Mild, serum sodium 134 on admission in setting of poor PO intake  - Given 1 liter NS bolus in ED, continuing with NS infusion at 100 cc/hr overnight  - Repeat chem panel in am    4. Anemia of iron-deficiency  - H/H within normal limits on admission  - Continue iron supplementation     DVT prophylaxis: sq Lovenox   Code Status: Full  Family Communication: Husband at bedside  Disposition Plan: Admit to med/surg  Admission status: Observation    Vianne Bulls MD [facility] Pager [phone]  If 7PM-7AM, please  contact night-coverage [email or web address] Password [email or web address]  03/03/2016, 7:04 PM

## 2016-03-03 NOTE — Progress Notes (Signed)
Received called by Dr. Rogene Houston from [facility]; who wants to admit Kim Gonzales secondary to pyelonephritis. Patient with some nausea, vomiting and leukopenia. No septic, but with concerns to be able to tolerate PO regimen. Patietn accepted to med-surg and observation.  [name] [id number]

## 2016-03-03 NOTE — ED Provider Notes (Signed)
CSN: [id number]     Arrival date & time 03/03/16  1107 History   First MD Initiated Contact with Patient 03/03/16 1108     Chief Complaint  Patient presents with  . Fever     (Consider location/radiation/quality/duration/timing/severity/associated sxs/prior Treatment) Patient is a 45 y.o. female presenting with fever. The history is provided by the patient.  Fever Associated symptoms: chills, dysuria and nausea   Associated symptoms: no chest pain, no confusion, no congestion, no diarrhea, no rash and no vomiting   Patient referred in from clinic at work. Patient is being treated for a urinary tract infection since May 1. Originally treated with Keflex then changed to Cipro questionable allergic reaction to Cipro and recent [place] for the past 3 days has been on Septra DS. Patient not improving. Initial urinary tract symptoms have evolved into fever and chills last evening nausea right-sided flank pain right CVA pain and lower abdominal pain. Patient did have a urine culture done from work and she brought in the results. Showing Escherichia coli. Resistant to penicillin and cephalosporins sensitive to the sulfurs the Floxin's and Macrodantin.  History reviewed. No pertinent past medical history. Past Surgical History  Procedure Laterality Date  . Knee arthroscopy    . Cesarean section      x3  . Shoulder arthroscopy    . Wisdom tooth extraction    . Tubal ligation      pptl  . Dilitation & currettage/hystroscopy with hydrothermal ablation N/A 10/10/2015    Procedure: DILATATION & CURETTAGE/HYSTEROSCOPY WITH HYDROTHERMAL ABLATION;  Surgeon: Crawford Givens, MD;  Location: [facility];  Service: Gynecology;  Laterality: N/A;   No family history on file. Social History  Substance Use Topics  . Smoking status: Never Smoker   . Smokeless tobacco: None  . Alcohol Use: No   OB History    No data available     Review of Systems  Constitutional: Positive for fever, chills, diaphoresis and  fatigue.  HENT: Negative for congestion.   Eyes: Negative for visual disturbance.  Respiratory: Negative for shortness of breath.   Cardiovascular: Negative for chest pain.  Gastrointestinal: Positive for nausea and abdominal pain. Negative for vomiting and diarrhea.  Genitourinary: Positive for dysuria and flank pain. Negative for hematuria.  Musculoskeletal: Positive for back pain. Negative for neck stiffness.  Skin: Negative for rash.  Hematological: Does not bruise/bleed easily.  Psychiatric/Behavioral: Negative for confusion.      Allergies  Review of patient's allergies indicates no known allergies.  Home Medications   Prior to Admission medications   Medication Sig Start Date End Date Taking? Authorizing Provider  Ciprofloxacin (CIPRO PO) Take by mouth.   Yes Historical Provider, MD  doxycycline (VIBRAMYCIN) 50 MG capsule Take one tablet by mouth twice a day for 7 days 10/10/15   Crawford Givens, MD  ferrous sulfate 325 (65 FE) MG tablet Take 325 mg by mouth daily with breakfast.    Historical Provider, MD  HYDROcodone-acetaminophen (NORCO/VICODIN) 5-325 MG tablet Take 1 tablet by mouth every 6 (six) hours as needed. 10/10/15   Crawford Givens, MD  ibuprofen (ADVIL,MOTRIN) 200 MG tablet Take 800 mg by mouth every 6 (six) hours as needed for mild pain.    Historical Provider, MD   BP 94/57 mmHg  Pulse 72  Temp(Src) 98.7 F (37.1 C) (Oral)  Resp 18  Ht 5' 7.5" (1.715 m)  Wt 71.487 kg  BMI 24.31 kg/m2  SpO2 96%  LMP 02/09/2016 Physical Exam  Constitutional: She is oriented  to person, place, and time. She appears well-developed and well-nourished. No distress.  HENT:  Head: Normocephalic and atraumatic.  Mouth/Throat: Oropharynx is clear and moist.  Eyes: Conjunctivae and EOM are normal. Pupils are equal, round, and reactive to light.  Neck: Normal range of motion. Neck supple.  Cardiovascular: Normal rate, regular rhythm and normal heart sounds.   Pulmonary/Chest: Effort  normal and breath sounds normal. No respiratory distress.  Abdominal: Soft. Bowel sounds are normal. There is no tenderness.  Musculoskeletal: Normal range of motion. She exhibits no edema.  Neurological: She is alert and oriented to person, place, and time. No cranial nerve deficit. She exhibits normal muscle tone. Coordination normal.  Skin: Skin is warm. No rash noted. There is erythema.  Nursing note and vitals reviewed.   ED Course  Procedures (including critical care time) Labs Review Labs Reviewed  CBC WITH DIFFERENTIAL/PLATELET - Abnormal; Notable for the following:    WBC 2.0 (*)    Neutro Abs 1.5 (*)    Lymphs Abs 0.3 (*)    All other components within normal limits  URINALYSIS, ROUTINE W REFLEX MICROSCOPIC (NOT AT [facility]) - Abnormal; Notable for the following:    Hgb urine dipstick MODERATE (*)    All other components within normal limits  COMPREHENSIVE METABOLIC PANEL - Abnormal; Notable for the following:    Sodium 134 (*)    Glucose, Bld 103 (*)    Creatinine, Ser 1.03 (*)    AST 44 (*)    All other components within normal limits  URINE MICROSCOPIC-ADD ON - Abnormal; Notable for the following:    Squamous Epithelial / LPF 0-5 (*)    Bacteria, UA MANY (*)    All other components within normal limits  URINE CULTURE  CULTURE, BLOOD (ROUTINE X 2)  CULTURE, BLOOD (ROUTINE X 2)  PREGNANCY, URINE  LIPASE, BLOOD  I-STAT CG4 LACTIC ACID, ED    Imaging Review Ct Renal Stone Study  03/03/2016  CLINICAL DATA:  Right flank pain for 1 month EXAM: CT ABDOMEN AND PELVIS WITHOUT CONTRAST TECHNIQUE: Multidetector CT imaging of the abdomen and pelvis was performed following the standard protocol without IV contrast. COMPARISON:  Pelvic ultrasound 12/28/2014 FINDINGS: Lower chest:  The lung bases are unremarkable. Hepatobiliary: Unenhanced liver shows no biliary ductal dilatation. No calcified gallstones are noted within gallbladder. No CBD dilatation. Pancreas: Unenhanced pancreas is  unremarkable. Spleen: Unenhanced spleen is unremarkable. Adrenals/Urinary Tract: No adrenal gland mass. Unenhanced kidneys are symmetrical in size. Nephrolithiasis. No hydronephrosis or hydroureter. No calcified ureteral calculi are noted. No calcified calculi are noted within under distended urinary bladder. Stomach/Bowel: There is no gastric outlet obstruction. The study is limited without IV or oral contrast. No small bowel obstruction. Abundant stool is noted in right colon and transverse colon. Abundant stool is noted within cecum. There is a low lying cecum. Normal appendix clearly visualized in coronal image 36. Moderate stool noted within and descending colon. Moderate stool noted within distal sigmoid colon. Moderate stool and gas noted within rectum. Vascular/Lymphatic: There is no aortic aneurysm. Pelvic phleboliths are noted. No retroperitoneal or mesenteric adenopathy. Reproductive: There is again noted retroverted uterus. There is anterior pedunculated uterine fibroid measures about 2.2 cm. No adnexal masses noted. No pelvic free fluid. Other: No ascites or free air. Musculoskeletal: No destructive bony lesions are noted. Sagittal images of the spine shows significant disc space flattening with vacuum disc phenomenon at L4-L5 level. Mild disc space flattening minimal posterior disc bulge at L3-L4 level. Mild  posterior disc bulge at L5-S1 level. No destructive bony lesions are noted within pelvis. IMPRESSION: 1. There is no evidence of nephrolithiasis. No hydronephrosis or hydroureter. No calcified ureteral calculi. 2. Abundant stool noted in right colon and redundant transverse colon. There is a low lying cecum. Abundant stool noted within cecum. No pericecal inflammation. Normal appendix. 3. Retroverted uterus. Anterior uterine fibroid measures at least 2.2 cm. No adnexal mass. No pelvic free fluid. 4. Degenerative changes lumbar spine. 5. No small bowel obstruction. Electronically Signed   By: Lahoma Crocker M.D.   On: 03/03/2016 13:33   I have personally reviewed and evaluated these images and lab results as part of my medical decision-making.   EKG Interpretation None      MDM   Final diagnoses:  Pyelonephritis  Leukopenia    Patient has been treated for a urinary tract infection has been on 3 different antibiotics. Symptoms have not resolved. They have actually gotten worse. Patient with fever and chills overnight. Associated with some nausea. No vomiting right flank pain and right CVA pain. Associated with a headache as well. No midline neck or back pain.  Patient initially treated with Keflex and based on urine culture was changed to Cipro questionable allergic reaction to Cipro so changed to Septra. Been on Septra for 3 days. Patient stated above actually symptoms are worse. Patient did have a culture done through her work clinic which showed that it was Escherichia coli that was sensitive to Septra and sensitive to Cipro and levofloxacin. But was resistant to penicillins and cephalosporins. Also resistant to tetracyclines. Sensitive to gentamicin.   Workup here urine with many bacteria but no significant white blood cells. Did have some red blood cells. Patient's peripheral CBC shows a leukopenia. Not clear if this was pre-existing or not. Patient's initial lactic acid was less than 2.  Patient received IV imipenem. Patient not worse here patient never hypotensive. But has had blood pressures in the low 90s. Most recent was a systolic of [id number]. Not tachycardic patient feels as if her fever has broken because she was diaphoretic.  We'll discuss with [facility] primary care doctor [facility]. Would recommend overnight admission for monitoring to make sure that she continues to improve since this is been ongoing for a number of days. Not exactly sure why she has the leukopenia. However do believe that symptoms probably are related to a pyelonephritis on the right side. CT  scan however had no acute findings. Renal function also was a little bit elevated regarding creatinine.  Currently patient not showing evidence of sepsis.    Fredia Sorrow, MD 03/03/16 [phone]

## 2016-03-03 NOTE — ED Notes (Signed)
2nd lactic acid cancelled by Dr Rogene Houston.

## 2016-03-03 NOTE — Progress Notes (Addendum)
CRITICAL VALUE ALERT  Critical value received:  Lactic acid 2.3     Date of notification:  03/03/16  Time of notification:  2234  Critical value read back:Yes.    Nurse who received alert:  Nancy Fetter, RN  MD notified (1st page):  Alm Bustard, MD  Time of first page:  2234  MD notified (2nd page):  Time of second page:  Responding MD:  Oypd  Time MD responded: [id number]

## 2016-03-03 NOTE — ED Notes (Signed)
Chills last night. Fever this am. Headache. Lower abdominal and back pain. She has been treated for a UTI x 10 days.

## 2016-03-04 ENCOUNTER — Observation Stay (HOSPITAL_COMMUNITY): Payer: BLUE CROSS/BLUE SHIELD

## 2016-03-04 DIAGNOSIS — R945 Abnormal results of liver function studies: Secondary | ICD-10-CM | POA: Insufficient documentation

## 2016-03-04 DIAGNOSIS — D759 Disease of blood and blood-forming organs, unspecified: Secondary | ICD-10-CM | POA: Diagnosis present

## 2016-03-04 DIAGNOSIS — D709 Neutropenia, unspecified: Secondary | ICD-10-CM

## 2016-03-04 DIAGNOSIS — R5081 Fever presenting with conditions classified elsewhere: Secondary | ICD-10-CM | POA: Diagnosis present

## 2016-03-04 DIAGNOSIS — N390 Urinary tract infection, site not specified: Secondary | ICD-10-CM | POA: Diagnosis present

## 2016-03-04 DIAGNOSIS — A419 Sepsis, unspecified organism: Secondary | ICD-10-CM | POA: Diagnosis present

## 2016-03-04 DIAGNOSIS — A7740 Ehrlichiosis, unspecified: Secondary | ICD-10-CM | POA: Diagnosis present

## 2016-03-04 DIAGNOSIS — I959 Hypotension, unspecified: Secondary | ICD-10-CM

## 2016-03-04 DIAGNOSIS — R51 Headache: Secondary | ICD-10-CM

## 2016-03-04 DIAGNOSIS — N920 Excessive and frequent menstruation with regular cycle: Secondary | ICD-10-CM | POA: Diagnosis present

## 2016-03-04 DIAGNOSIS — Z79899 Other long term (current) drug therapy: Secondary | ICD-10-CM | POA: Diagnosis not present

## 2016-03-04 DIAGNOSIS — W57XXXA Bitten or stung by nonvenomous insect and other nonvenomous arthropods, initial encounter: Secondary | ICD-10-CM | POA: Diagnosis present

## 2016-03-04 DIAGNOSIS — R509 Fever, unspecified: Secondary | ICD-10-CM | POA: Diagnosis not present

## 2016-03-04 DIAGNOSIS — B9620 Unspecified Escherichia coli [E. coli] as the cause of diseases classified elsewhere: Secondary | ICD-10-CM | POA: Diagnosis present

## 2016-03-04 DIAGNOSIS — D689 Coagulation defect, unspecified: Secondary | ICD-10-CM

## 2016-03-04 DIAGNOSIS — D61818 Other pancytopenia: Secondary | ICD-10-CM | POA: Diagnosis not present

## 2016-03-04 DIAGNOSIS — R7989 Other specified abnormal findings of blood chemistry: Secondary | ICD-10-CM | POA: Diagnosis present

## 2016-03-04 DIAGNOSIS — D696 Thrombocytopenia, unspecified: Secondary | ICD-10-CM | POA: Diagnosis not present

## 2016-03-04 DIAGNOSIS — D702 Other drug-induced agranulocytosis: Secondary | ICD-10-CM | POA: Diagnosis not present

## 2016-03-04 DIAGNOSIS — D72819 Decreased white blood cell count, unspecified: Secondary | ICD-10-CM | POA: Diagnosis not present

## 2016-03-04 DIAGNOSIS — E871 Hypo-osmolality and hyponatremia: Secondary | ICD-10-CM | POA: Diagnosis present

## 2016-03-04 DIAGNOSIS — K5900 Constipation, unspecified: Secondary | ICD-10-CM | POA: Diagnosis present

## 2016-03-04 DIAGNOSIS — N12 Tubulo-interstitial nephritis, not specified as acute or chronic: Secondary | ICD-10-CM | POA: Diagnosis present

## 2016-03-04 DIAGNOSIS — D509 Iron deficiency anemia, unspecified: Secondary | ICD-10-CM | POA: Diagnosis not present

## 2016-03-04 LAB — CBC WITH DIFFERENTIAL/PLATELET
BASOS PCT: 0 %
BASOS PCT: 1 %
Basophils Absolute: 0 10*3/uL (ref 0.0–0.1)
Basophils Absolute: 0 10*3/uL (ref 0.0–0.1)
EOS ABS: 0 10*3/uL (ref 0.0–0.7)
EOS PCT: 0 %
Eosinophils Absolute: 0 10*3/uL (ref 0.0–0.7)
Eosinophils Relative: 0 %
HCT: 35.7 % — ABNORMAL LOW (ref 36.0–46.0)
HEMATOCRIT: 33.9 % — AB (ref 36.0–46.0)
HEMOGLOBIN: 11.5 g/dL — AB (ref 12.0–15.0)
HEMOGLOBIN: 11.9 g/dL — AB (ref 12.0–15.0)
LYMPHS ABS: 0.2 10*3/uL — AB (ref 0.7–4.0)
LYMPHS ABS: 0.3 10*3/uL — AB (ref 0.7–4.0)
Lymphocytes Relative: 21 %
Lymphocytes Relative: 34 %
MCH: 26.9 pg (ref 26.0–34.0)
MCH: 26.7 pg (ref 26.0–34.0)
MCHC: 33.9 g/dL (ref 30.0–36.0)
MCHC: 33.3 g/dL (ref 30.0–36.0)
MCV: 79.4 fL (ref 78.0–100.0)
MCV: 80 fL (ref 78.0–100.0)
MONO ABS: 0 10*3/uL — AB (ref 0.1–1.0)
Monocytes Absolute: 0 10*3/uL — ABNORMAL LOW (ref 0.1–1.0)
Monocytes Relative: 5 %
Monocytes Relative: 3 %
NEUTROS ABS: 0.5 10*3/uL — AB (ref 1.7–7.7)
Neutro Abs: 0.7 10*3/uL — ABNORMAL LOW (ref 1.7–7.7)
Neutrophils Relative %: 74 %
Neutrophils Relative %: 62 %
PLATELETS: 112 10*3/uL — AB (ref 150–400)
Platelets: 120 10*3/uL — ABNORMAL LOW (ref 150–400)
RBC: 4.27 MIL/uL (ref 3.87–5.11)
RBC: 4.46 MIL/uL (ref 3.87–5.11)
RDW: 15 % (ref 11.5–15.5)
RDW: 15 % (ref 11.5–15.5)
WBC: 0.9 10*3/uL — AB (ref 4.0–10.5)
WBC: 0.8 10*3/uL — CL (ref 4.0–10.5)

## 2016-03-04 LAB — COMPREHENSIVE METABOLIC PANEL
ALK PHOS: 44 U/L (ref 38–126)
ALK PHOS: 43 U/L (ref 38–126)
ALT: 45 U/L (ref 14–54)
ALT: 46 U/L (ref 14–54)
ANION GAP: 7 (ref 5–15)
AST: 54 U/L — AB (ref 15–41)
AST: 55 U/L — ABNORMAL HIGH (ref 15–41)
Albumin: 3.2 g/dL — ABNORMAL LOW (ref 3.5–5.0)
Albumin: 3.3 g/dL — ABNORMAL LOW (ref 3.5–5.0)
Anion gap: 9 (ref 5–15)
BILIRUBIN TOTAL: 0.5 mg/dL (ref 0.3–1.2)
BUN: 7 mg/dL (ref 6–20)
BUN: 7 mg/dL (ref 6–20)
CALCIUM: 8.2 mg/dL — AB (ref 8.9–10.3)
CO2: 18 mmol/L — ABNORMAL LOW (ref 22–32)
CO2: 21 mmol/L — AB (ref 22–32)
CREATININE: 0.85 mg/dL (ref 0.44–1.00)
Calcium: 8 mg/dL — ABNORMAL LOW (ref 8.9–10.3)
Chloride: 110 mmol/L (ref 101–111)
Chloride: 107 mmol/L (ref 101–111)
Creatinine, Ser: 0.92 mg/dL (ref 0.44–1.00)
GFR calc Af Amer: >60 mL/min (ref >60)
GFR calc non Af Amer: >60 mL/min (ref >60)
Glucose, Bld: 106 mg/dL — ABNORMAL HIGH (ref 65–99)
Glucose, Bld: 101 mg/dL — ABNORMAL HIGH (ref 65–99)
Potassium: 3.6 mmol/L (ref 3.5–5.1)
Potassium: 4.1 mmol/L (ref 3.5–5.1)
SODIUM: 135 mmol/L (ref 135–145)
Sodium: 137 mmol/L (ref 135–145)
TOTAL PROTEIN: 5.5 g/dL — AB (ref 6.5–8.1)
Total Bilirubin: 0.5 mg/dL (ref 0.3–1.2)
Total Protein: 5.5 g/dL — ABNORMAL LOW (ref 6.5–8.1)

## 2016-03-04 LAB — PATHOLOGIST SMEAR REVIEW

## 2016-03-04 LAB — MRSA PCR SCREENING: MRSA BY PCR: NEGATIVE

## 2016-03-04 LAB — HIV ANTIBODY (ROUTINE TESTING W REFLEX): HIV Screen 4th Generation wRfx: NONREACTIVE

## 2016-03-04 LAB — GLUCOSE, CAPILLARY: Glucose-Capillary: 107 mg/dL — ABNORMAL HIGH (ref 65–99)

## 2016-03-04 LAB — PROTIME-INR
INR: 1.35 (ref 0.00–1.49)
Prothrombin Time: 16.8 seconds — ABNORMAL HIGH (ref 11.6–15.2)

## 2016-03-04 LAB — APTT: APTT: 40 s — AB (ref 24–37)

## 2016-03-04 LAB — PROCALCITONIN: PROCALCITONIN: 0.18 ng/mL

## 2016-03-04 LAB — FIBRINOGEN: FIBRINOGEN: 295 mg/dL (ref 204–475)

## 2016-03-04 LAB — SAVE SMEAR

## 2016-03-04 LAB — LACTIC ACID, PLASMA
LACTIC ACID, VENOUS: 0.9 mmol/L (ref 0.5–2.0)
Lactic Acid, Venous: 0.8 mmol/L (ref 0.5–2.0)

## 2016-03-04 LAB — VITAMIN B12: VITAMIN B 12: 546 pg/mL (ref 180–914)

## 2016-03-04 MED ORDER — SODIUM CHLORIDE 0.9 % IV BOLUS (SEPSIS)
1000.0000 mL | Freq: Once | INTRAVENOUS | Status: AC
  Administered 2016-03-04: 1000 mL via INTRAVENOUS

## 2016-03-04 MED ORDER — OXYCODONE HCL 5 MG PO TABS
5.0000 mg | ORAL_TABLET | ORAL | Status: DC | PRN
  Administered 2016-03-04: 5 mg via ORAL
  Filled 2016-03-04: qty 1

## 2016-03-04 MED ORDER — PIPERACILLIN-TAZOBACTAM 3.375 G IVPB 30 MIN
3.3750 g | Freq: Once | INTRAVENOUS | Status: DC
  Administered 2016-03-04: 3.375 g via INTRAVENOUS
  Filled 2016-03-04: qty 50

## 2016-03-04 MED ORDER — SODIUM CHLORIDE 0.9 % IV SOLN
INTRAVENOUS | Status: DC
  Administered 2016-03-04 – 2016-03-07 (×4): via INTRAVENOUS

## 2016-03-04 MED ORDER — POLYETHYLENE GLYCOL 3350 17 G PO PACK
17.0000 g | PACK | Freq: Two times a day (BID) | ORAL | Status: DC
  Administered 2016-03-04 – 2016-03-06 (×4): 17 g via ORAL
  Filled 2016-03-04 (×5): qty 1

## 2016-03-04 MED ORDER — VANCOMYCIN HCL IN DEXTROSE 1-5 GM/200ML-% IV SOLN
1000.0000 mg | Freq: Once | INTRAVENOUS | Status: DC
Start: 2016-03-04 — End: 2016-03-04

## 2016-03-04 MED ORDER — SODIUM CHLORIDE 0.9 % IV SOLN
500.0000 mg | Freq: Four times a day (QID) | INTRAVENOUS | Status: DC
  Administered 2016-03-04 – 2016-03-06 (×8): 500 mg via INTRAVENOUS
  Filled 2016-03-04 (×9): qty 500

## 2016-03-04 MED ORDER — SODIUM CHLORIDE 0.9 % IV BOLUS (SEPSIS)
250.0000 mL | Freq: Once | INTRAVENOUS | Status: AC
Start: 2016-03-04 — End: 2016-03-04
  Administered 2016-03-04: 250 mL via INTRAVENOUS

## 2016-03-04 MED ORDER — VANCOMYCIN HCL IN DEXTROSE 750-5 MG/150ML-% IV SOLN
750.0000 mg | Freq: Three times a day (TID) | INTRAVENOUS | Status: DC
  Filled 2016-03-04: qty 150

## 2016-03-04 MED ORDER — DOXYCYCLINE HYCLATE 100 MG IV SOLR
100.0000 mg | Freq: Two times a day (BID) | INTRAVENOUS | Status: DC
  Administered 2016-03-04 – 2016-03-05 (×3): 100 mg via INTRAVENOUS
  Filled 2016-03-04 (×3): qty 100

## 2016-03-04 MED ORDER — TBO-FILGRASTIM 300 MCG/0.5ML ~~LOC~~ SOSY
300.0000 ug | PREFILLED_SYRINGE | Freq: Every day | SUBCUTANEOUS | Status: DC
Start: 2016-03-04 — End: 2016-03-04
  Filled 2016-03-04: qty 0.5

## 2016-03-04 MED ORDER — OXYCODONE HCL 5 MG PO TABS
5.0000 mg | ORAL_TABLET | ORAL | Status: DC | PRN
  Administered 2016-03-04 – 2016-03-06 (×9): 5 mg via ORAL
  Filled 2016-03-04 (×9): qty 1

## 2016-03-04 MED ORDER — TRAMADOL HCL 50 MG PO TABS
50.0000 mg | ORAL_TABLET | Freq: Three times a day (TID) | ORAL | Status: DC | PRN
  Administered 2016-03-04 – 2016-03-06 (×5): 50 mg via ORAL
  Filled 2016-03-04 (×5): qty 1

## 2016-03-04 NOTE — Consult Note (Signed)
Date of Admission:  03/03/2016  Date of Consult:  03/04/2016  Reason for Consult: Febrile neutropenia with SIRS/Sepsis Referring Physician: Dr. Tyrell Antonio   HPI: Kim Gonzales is an 45 y.o. female who 4 weeks ago had developed dysuria, urinary frequency and hesitancy. She did not seek medical evaluation until approximately 10 days prior to her admission. At that time, the patient was started on empiric therapy with Keflex for suspected UTI. Culture was obtained and grew 10-25K  Escherichia coli, which looks to be AN ESBL carbapenems, to Bactrim, fluoroquinolones, and Macrodantin. She was rx cipro but became flushed and thought she was having a reaction to this antiboitic and she was changed to oral bactrim which she took for 3 days. She then began having worsening of back and flank pain she had previously and now with intense chills and fevers. On arrival to ED she was noted to profoundly neutropenic and leukopenic. She also in the interim has been developing [place].  She has a mild elevation of AST but normal ALT. She had minimal hyponatremia.  CXR negative. CT renal stone sudy did not show stones or cause for infection. UA with 0-5 WBC, 6-30 RBC  She has been admitted and transferred to stepdown unit.  She was started on imipenem and levaquin but has had worsening of her leukopenia and TTPenia.  She also relates having removed a tick from her body aprpoxmiately 10 days ago and then another that had not yet embedded itself a few days ago.  She has been started on doxycyline 135m bid this am.        Past Medical History  Diagnosis Date  . Anemia     Past Surgical History  Procedure Laterality Date  . Knee arthroscopy    . Cesarean section      x3  . Shoulder arthroscopy    . Wisdom tooth extraction    . Tubal ligation      pptl  . Dilitation & currettage/hystroscopy with hydrothermal ablation N/A 10/10/2015    Procedure: DILATATION & CURETTAGE/HYSTEROSCOPY WITH  HYDROTHERMAL ABLATION;  Surgeon: NCrawford Givens MD;  [facility];  Service: Gynecology;  Laterality: N/A;  . Dilation and curettage of uterus      Social History:  reports that she has never smoked. She does not have any smokeless tobacco history on file. She reports that she does not drink alcohol or use illicit drugs.   Family History  Problem Relation Age of Onset  . Kidney nephrosis Neg Hx     No Known Allergies   Medications: I have reviewed patients current medications as documented in Epic Anti-infectives    Start     Dose/Rate Route Frequency Ordered Stop   03/04/16 1200  imipenem-cilastatin (PRIMAXIN) 500 mg in sodium chloride 0.9 % 100 mL IVPB     500 mg 200 mL/hr over 30 Minutes Intravenous Every 6 hours 03/04/16 0822     03/04/16 1000  doxycycline (VIBRAMYCIN) 100 mg in dextrose 5 % 250 mL IVPB     100 mg 125 mL/hr over 120 Minutes Intravenous Every 12 hours 03/04/16 0811     03/04/16 1000  vancomycin (VANCOCIN) IVPB 750 mg/150 ml premix  Status:  Discontinued     750 mg 150 mL/hr over 60 Minutes Intravenous Every 8 hours 03/04/16 0822 03/04/16 0825   03/04/16 0800  piperacillin-tazobactam (ZOSYN) IVPB 3.375 g  Status:  Discontinued     3.375 g 100 mL/hr over 30 Minutes Intravenous  Once 03/04/16 0727 03/04/16 0809   03/04/16 0730  vancomycin (VANCOCIN) IVPB 1000 mg/200 mL premix  Status:  Discontinued     1,000 mg 200 mL/hr over 60 Minutes Intravenous  Once 03/04/16 0727 03/04/16 0822   03/03/16 2200  levofloxacin (LEVAQUIN) IVPB 750 mg  Status:  Discontinued     750 mg 100 mL/hr over 90 Minutes Intravenous Every 24 hours 03/03/16 1903 03/04/16 0809   03/03/16 1330  imipenem-cilastatin (PRIMAXIN) 500 mg in sodium chloride 0.9 % 100 mL IVPB     500 mg 200 mL/hr over 30 Minutes Intravenous  Once 03/03/16 1320 03/03/16 1442         ROS: + HA, fatigue, malaise, light headedness, suprapubic pain, nausea, and otherwise as in HPI otherwise remainder of 12 point  Review of Systems is negative    Blood pressure 107/56, pulse 72, temperature 100.4 F (38 C), temperature source Oral, resp. rate 12, height 5' 7.5" (1.715 m), weight 158 lb 4.6 oz (71.8 kg), last menstrual period 02/09/2016, SpO2 98 %. General: Alert and awake, oriented x3, not in any acute distress. HEENT: anicteric sclera,  EOMI, oropharynx clear and without exudate Cardiovascular: regular rate, normal r,  no murmur rubs or gallops Pulmonary: clear to auscultation bilaterally, no wheezing, rales or rhonchi Gastrointestinal: soft TTP in suprapubic area, nondistended, normal bowel sounds, Musculoskeletal: no  clubbing or edema noted bilaterally Skin, soft tissue: no rashes Neuro: nonfocal, strength and sensation intact   Results for orders placed or performed during the hospital encounter of 03/03/16 (from the past 48 hour(s))  Urinalysis, Routine w reflex microscopic (not at [facility])     Status: Abnormal   Collection Time: 03/03/16 12:05 PM  Result Value Ref Range   Color, Urine YELLOW YELLOW   APPearance CLEAR CLEAR   Specific Gravity, Urine 1.011 1.005 - 1.030   pH 6.0 5.0 - 8.0   Glucose, UA NEGATIVE NEGATIVE mg/dL   Hgb urine dipstick MODERATE (A) NEGATIVE   Bilirubin Urine NEGATIVE NEGATIVE   Ketones, ur NEGATIVE NEGATIVE mg/dL   Protein, ur NEGATIVE NEGATIVE mg/dL   Nitrite NEGATIVE NEGATIVE   Leukocytes, UA NEGATIVE NEGATIVE  Pregnancy, urine     Status: None   Collection Time: 03/03/16 12:05 PM  Result Value Ref Range   Preg Test, Ur NEGATIVE NEGATIVE    Comment:        THE SENSITIVITY OF THIS METHODOLOGY IS >20 mIU/mL.   Urine microscopic-add on     Status: Abnormal   Collection Time: 03/03/16 12:05 PM  Result Value Ref Range   Squamous Epithelial / LPF 0-5 (A) NONE SEEN   WBC, UA 0-5 0 - 5 WBC/hpf   RBC / HPF 6-30 0 - 5 RBC/hpf   Bacteria, UA MANY (A) NONE SEEN   Urine-Other MUCOUS PRESENT   Culture, blood (Routine X 2) w Reflex to ID Panel     Status: None  (Preliminary result)   Collection Time: 03/03/16 12:20 PM  Result Value Ref Range   Specimen Description BLOOD RIGHT ANTECUBITAL    Special Requests BOTTLES DRAWN AEROBIC AND ANAEROBIC  5CC    Culture      NO GROWTH < 24 HOURS Performed at [facility]    Report Status PENDING   I-Stat CG4 Lactic Acid, ED     Status: None   Collection Time: 03/03/16 12:27 PM  Result Value Ref Range   Lactic Acid, Venous 1.10 0.5 - 2.0 mmol/L  CBC with Differential/Platelet  Status: Abnormal   Collection Time: 03/03/16 12:30 PM  Result Value Ref Range   WBC 2.0 (L) 4.0 - 10.5 K/uL   RBC 4.96 3.87 - 5.11 MIL/uL   Hemoglobin 13.2 12.0 - 15.0 g/dL   HCT 39.5 36.0 - 46.0 %   MCV 79.6 78.0 - 100.0 fL   MCH 26.6 26.0 - 34.0 pg   MCHC 33.4 30.0 - 36.0 g/dL   RDW 15.1 11.5 - 15.5 %   Platelets 172 150 - 400 K/uL   Neutrophils Relative % 76 %   Neutro Abs 1.5 (L) 1.7 - 7.7 K/uL   Lymphocytes Relative 17 %   Lymphs Abs 0.3 (L) 0.7 - 4.0 K/uL   Monocytes Relative 7 %   Monocytes Absolute 0.1 0.1 - 1.0 K/uL   Eosinophils Relative 0 %   Eosinophils Absolute 0.0 0.0 - 0.7 K/uL   Basophils Relative 0 %   Basophils Absolute 0.0 0.0 - 0.1 K/uL  Comprehensive metabolic panel     Status: Abnormal   Collection Time: 03/03/16 12:30 PM  Result Value Ref Range   Sodium 134 (L) 135 - 145 mmol/L   Potassium 3.6 3.5 - 5.1 mmol/L   Chloride 105 101 - 111 mmol/L   CO2 24 22 - 32 mmol/L   Glucose, Bld 103 (H) 65 - 99 mg/dL   BUN 8 6 - 20 mg/dL   Creatinine, Ser 1.03 (H) 0.44 - 1.00 mg/dL   Calcium 9.1 8.9 - 10.3 mg/dL   Total Protein 7.6 6.5 - 8.1 g/dL   Albumin 4.4 3.5 - 5.0 g/dL   AST 44 (H) 15 - 41 U/L   ALT 35 14 - 54 U/L   Alkaline Phosphatase 38 38 - 126 U/L   Total Bilirubin 0.3 0.3 - 1.2 mg/dL   GFR calc non Af Amer >60 >60 mL/min   GFR calc Af Amer >60 >60 mL/min    Comment: (NOTE) The eGFR has been calculated using the CKD EPI equation. This calculation has not been validated in all  clinical situations. eGFR's persistently <60 mL/min signify possible Chronic Kidney Disease.    Anion gap 5 5 - 15  Lipase, blood     Status: None   Collection Time: 03/03/16 12:30 PM  Result Value Ref Range   Lipase 43 11 - 51 U/L  Culture, blood (Routine X 2) w Reflex to ID Panel     Status: None (Preliminary result)   Collection Time: 03/03/16  2:05 PM  Result Value Ref Range   Specimen Description BLOOD RIGHT HAND    Special Requests BOTTLES DRAWN AEROBIC AND ANAEROBIC  5CC    Culture      NO GROWTH < 24 HOURS Performed at [facility]    Report Status PENDING   Lactic acid, plasma     Status: None   Collection Time: 03/03/16  7:13 PM  Result Value Ref Range   Lactic Acid, Venous 0.8 0.5 - 2.0 mmol/L  Procalcitonin     Status: None   Collection Time: 03/03/16  7:13 PM  Result Value Ref Range   Procalcitonin 0.16 ng/mL    Comment:        Interpretation: PCT (Procalcitonin) <= 0.5 ng/mL: Systemic infection (sepsis) is not likely. Local bacterial infection is possible. (NOTE)         ICU PCT Algorithm               Non ICU PCT Algorithm    ----------------------------     ------------------------------  PCT < 0.25 ng/mL                 PCT < 0.1 ng/mL     Stopping of antibiotics            Stopping of antibiotics       strongly encouraged.               strongly encouraged.    ----------------------------     ------------------------------       PCT level decrease by               PCT < 0.25 ng/mL       >= 80% from peak PCT       OR PCT 0.25 - 0.5 ng/mL          Stopping of antibiotics                                             encouraged.     Stopping of antibiotics           encouraged.    ----------------------------     ------------------------------       PCT level decrease by              PCT >= 0.25 ng/mL       < 80% from peak PCT        AND PCT >= 0.5 ng/mL            Continuin g antibiotics                                               encouraged.       Continuing antibiotics            encouraged.    ----------------------------     ------------------------------     PCT level increase compared          PCT > 0.5 ng/mL         with peak PCT AND          PCT >= 0.5 ng/mL             Escalation of antibiotics                                          strongly encouraged.      Escalation of antibiotics        strongly encouraged.   Protime-INR     Status: Abnormal   Collection Time: 03/03/16  7:13 PM  Result Value Ref Range   Prothrombin Time 15.3 (H) 11.6 - 15.2 seconds   INR 1.20 0.00 - 1.49  APTT     Status: None   Collection Time: 03/03/16  7:13 PM  Result Value Ref Range   aPTT 31 24 - 37 seconds  Lactic acid, plasma     Status: Abnormal   Collection Time: 03/03/16  9:50 PM  Result Value Ref Range   Lactic Acid, Venous 2.3 (HH) 0.5 - 2.0 mmol/L    Comment: CRITICAL RESULT CALLED TO, READ BACK BY AND VERIFIED WITH: DUMAS,M AT 2232 ON 03/03/16 BY MOSLEY,J   CBC  with Differential     Status: Abnormal   Collection Time: 03/04/16  5:13 AM  Result Value Ref Range   WBC 0.9 (LL) 4.0 - 10.5 K/uL    Comment: CRITICAL RESULT CALLED TO, READ BACK BY AND VERIFIED WITH: N. DUMAS RN AT 0645 ON 05.10.17 BY SHUEA    RBC 4.27 3.87 - 5.11 MIL/uL   Hemoglobin 11.5 (L) 12.0 - 15.0 g/dL   HCT 33.9 (L) 36.0 - 46.0 %   MCV 79.4 78.0 - 100.0 fL   MCH 26.9 26.0 - 34.0 pg   MCHC 33.9 30.0 - 36.0 g/dL   RDW 15.0 11.5 - 15.5 %   Platelets 120 (L) 150 - 400 K/uL   Neutrophils Relative % 74 %   Lymphocytes Relative 21 %   Monocytes Relative 5 %   Eosinophils Relative 0 %   Basophils Relative 0 %   Neutro Abs 0.7 (L) 1.7 - 7.7 K/uL   Lymphs Abs 0.2 (L) 0.7 - 4.0 K/uL   Monocytes Absolute 0.0 (L) 0.1 - 1.0 K/uL   Eosinophils Absolute 0.0 0.0 - 0.7 K/uL   Basophils Absolute 0.0 0.0 - 0.1 K/uL   WBC Morphology WHITE COUNT CONFIRMED ON SMEAR   Comprehensive metabolic panel     Status: Abnormal   Collection Time: 03/04/16  5:13  AM  Result Value Ref Range   Sodium 137 135 - 145 mmol/L   Potassium 3.6 3.5 - 5.1 mmol/L   Chloride 110 101 - 111 mmol/L   CO2 18 (L) 22 - 32 mmol/L   Glucose, Bld 106 (H) 65 - 99 mg/dL   BUN 7 6 - 20 mg/dL   Creatinine, Ser 0.85 0.44 - 1.00 mg/dL   Calcium 8.0 (L) 8.9 - 10.3 mg/dL   Total Protein 5.5 (L) 6.5 - 8.1 g/dL   Albumin 3.2 (L) 3.5 - 5.0 g/dL   AST 54 (H) 15 - 41 U/L   ALT 45 14 - 54 U/L   Alkaline Phosphatase 44 38 - 126 U/L   Total Bilirubin 0.5 0.3 - 1.2 mg/dL   GFR calc non Af Amer >60 >60 mL/min   GFR calc Af Amer >60 >60 mL/min    Comment: (NOTE) The eGFR has been calculated using the CKD EPI equation. This calculation has not been validated in all clinical situations. eGFR's persistently <60 mL/min signify possible Chronic Kidney Disease.    Anion gap 9 5 - 15  Pathologist smear review     Status: None   Collection Time: 03/04/16  5:13 AM  Result Value Ref Range   Path Review Reviewed by Kalman Drape, M.D.     Comment: 05.10.17 LEUKOPENIA WITH NEUTROPENIA, MILD THROMBOCYTOPENIA.   Glucose, capillary     Status: Abnormal   Collection Time: 03/04/16  7:22 AM  Result Value Ref Range   Glucose-Capillary 107 (H) 65 - 99 mg/dL   Comment 1 Notify RN   CBC with Differential     Status: Abnormal   Collection Time: 03/04/16  8:00 AM  Result Value Ref Range   WBC 0.8 (LL) 4.0 - 10.5 K/uL    Comment: CRITICAL VALUE NOTED.  VALUE IS CONSISTENT WITH PREVIOUSLY REPORTED AND CALLED VALUE.   RBC 4.46 3.87 - 5.11 MIL/uL   Hemoglobin 11.9 (L) 12.0 - 15.0 g/dL   HCT 35.7 (L) 36.0 - 46.0 %   MCV 80.0 78.0 - 100.0 fL   MCH 26.7 26.0 - 34.0 pg   MCHC 33.3 30.0 - 36.0  g/dL   RDW 15.0 11.5 - 15.5 %   Platelets 112 (L) 150 - 400 K/uL    Comment: CONSISTENT WITH PREVIOUS RESULT   Neutrophils Relative % 62 %   Lymphocytes Relative 34 %   Monocytes Relative 3 %   Eosinophils Relative 0 %   Basophils Relative 1 %   Neutro Abs 0.5 (L) 1.7 - 7.7 K/uL   Lymphs Abs 0.3 (L)  0.7 - 4.0 K/uL   Monocytes Absolute 0.0 (L) 0.1 - 1.0 K/uL   Eosinophils Absolute 0.0 0.0 - 0.7 K/uL   Basophils Absolute 0.0 0.0 - 0.1 K/uL   WBC Morphology VACUOLATED NEUTROPHILS   Comprehensive metabolic panel     Status: Abnormal   Collection Time: 03/04/16  8:00 AM  Result Value Ref Range   Sodium 135 135 - 145 mmol/L   Potassium 4.1 3.5 - 5.1 mmol/L   Chloride 107 101 - 111 mmol/L   CO2 21 (L) 22 - 32 mmol/L   Glucose, Bld 101 (H) 65 - 99 mg/dL   BUN 7 6 - 20 mg/dL   Creatinine, Ser 0.92 0.44 - 1.00 mg/dL   Calcium 8.2 (L) 8.9 - 10.3 mg/dL   Total Protein 5.5 (L) 6.5 - 8.1 g/dL   Albumin 3.3 (L) 3.5 - 5.0 g/dL   AST 55 (H) 15 - 41 U/L   ALT 46 14 - 54 U/L   Alkaline Phosphatase 43 38 - 126 U/L   Total Bilirubin 0.5 0.3 - 1.2 mg/dL   GFR calc non Af Amer >60 >60 mL/min   GFR calc Af Amer >60 >60 mL/min    Comment: (NOTE) The eGFR has been calculated using the CKD EPI equation. This calculation has not been validated in all clinical situations. eGFR's persistently <60 mL/min signify possible Chronic Kidney Disease.    Anion gap 7 5 - 15  Lactic acid, plasma     Status: None   Collection Time: 03/04/16  8:00 AM  Result Value Ref Range   Lactic Acid, Venous 0.8 0.5 - 2.0 mmol/L  Procalcitonin     Status: None   Collection Time: 03/04/16  8:00 AM  Result Value Ref Range   Procalcitonin 0.18 ng/mL    Comment:        Interpretation: PCT (Procalcitonin) <= 0.5 ng/mL: Systemic infection (sepsis) is not likely. Local bacterial infection is possible. (NOTE)         ICU PCT Algorithm               Non ICU PCT Algorithm    ----------------------------     ------------------------------         PCT < 0.25 ng/mL                 PCT < 0.1 ng/mL     Stopping of antibiotics            Stopping of antibiotics       strongly encouraged.               strongly encouraged.    ----------------------------     ------------------------------       PCT level decrease by                PCT < 0.25 ng/mL       >= 80% from peak PCT       OR PCT 0.25 - 0.5 ng/mL          Stopping of antibiotics  encouraged.     Stopping of antibiotics           encouraged.    ----------------------------     ------------------------------       PCT level decrease by              PCT >= 0.25 ng/mL       < 80% from peak PCT        AND PCT >= 0.5 ng/mL            Continuin g antibiotics                                              encouraged.       Continuing antibiotics            encouraged.    ----------------------------     ------------------------------     PCT level increase compared          PCT > 0.5 ng/mL         with peak PCT AND          PCT >= 0.5 ng/mL             Escalation of antibiotics                                          strongly encouraged.      Escalation of antibiotics        strongly encouraged.   Protime-INR     Status: Abnormal   Collection Time: 03/04/16  8:00 AM  Result Value Ref Range   Prothrombin Time 16.8 (H) 11.6 - 15.2 seconds   INR 1.35 0.00 - 1.49  APTT     Status: Abnormal   Collection Time: 03/04/16  8:00 AM  Result Value Ref Range   aPTT 40 (H) 24 - 37 seconds    Comment:        IF BASELINE aPTT IS ELEVATED, SUGGEST PATIENT RISK ASSESSMENT BE USED TO DETERMINE APPROPRIATE ANTICOAGULANT THERAPY.   HIV antibody     Status: None   Collection Time: 03/04/16  8:00 AM  Result Value Ref Range   HIV Screen 4th Generation wRfx Non Reactive Non Reactive    Comment: (NOTE) Performed At: [facility] [place], [place] [id number] Lindon Romp MD [id number]   Save smear     Status: None   Collection Time: 03/04/16  8:00 AM  Result Value Ref Range   Smear Review SMEAR STAINED AND AVAILABLE FOR REVIEW   Vitamin B12     Status: None   Collection Time: 03/04/16  8:00 AM  Result Value Ref Range   Vitamin B-12 546 180 - 914 pg/mL    Comment: (NOTE) This assay is not  validated for testing neonatal or myeloproliferative syndrome specimens for Vitamin B12 levels. Performed at [facility]   Fibrinogen     Status: None   Collection Time: 03/04/16  8:00 AM  Result Value Ref Range   Fibrinogen 295 204 - 475 mg/dL  Lactic acid, plasma     Status: None   Collection Time: 03/04/16 10:22 AM  Result Value Ref Range   Lactic Acid, Venous 0.9 0.5 - 2.0 mmol/L  MRSA PCR Screening  Status: None   Collection Time: 03/04/16 11:29 AM  Result Value Ref Range   MRSA by PCR NEGATIVE NEGATIVE    Comment:        The GeneXpert MRSA Assay (FDA approved for NASAL specimens only), is one component of a comprehensive MRSA colonization surveillance program. It is not intended to diagnose MRSA infection nor to guide or monitor treatment for MRSA infections.    _0 (sdes,specrequest,cult,reptstatus)   ) Recent Results (from the past 720 hour(s))  Culture, blood (Routine X 2) w Reflex to ID Panel     Status: None (Preliminary result)   Collection Time: 03/03/16 12:20 PM  Result Value Ref Range Status   Specimen Description BLOOD RIGHT ANTECUBITAL  Final   Special Requests BOTTLES DRAWN AEROBIC AND ANAEROBIC  5CC  Final   Culture   Final    NO GROWTH < 24 HOURS Performed at [facility]    Report Status PENDING  Incomplete  Culture, blood (Routine X 2) w Reflex to ID Panel     Status: None (Preliminary result)   Collection Time: 03/03/16  2:05 PM  Result Value Ref Range Status   Specimen Description BLOOD RIGHT HAND  Final   Special Requests BOTTLES DRAWN AEROBIC AND ANAEROBIC  5CC  Final   Culture   Final    NO GROWTH < 24 HOURS Performed at [facility]    Report Status PENDING  Incomplete  MRSA PCR Screening     Status: None   Collection Time: 03/04/16 11:29 AM  Result Value Ref Range Status   MRSA by PCR NEGATIVE NEGATIVE Final    Comment:        The GeneXpert MRSA Assay (FDA approved for NASAL specimens only), is  one component of a comprehensive MRSA colonization surveillance program. It is not intended to diagnose MRSA infection nor to guide or monitor treatment for MRSA infections.      Impression/Recommendation  Principal Problem:   Neutropenic fever (HCC) Active Problems:   Pyelonephritis   Leukopenia   Hyponatremia   Anemia, iron deficiency   Sepsis (HCC)   Kim Gonzales is a 45 y.o. female with admission with febrile neutropenia and SIRS, sepsis picture  #1 Febrile neutropenia:   There are in my view TWO main likely causes here  #1 She could have a Bactrim induced bone marrow suppression with leukopenia, neutropenia and now TTTPenia and now has neutropenic fevers secondary to this myelosuppression  #2 she could have Ehrlichiosis which classically can cause a profound leukopenia and TTPenia vs RMSF  Certainly other possibilities would include occult infection elsewhere but I doubt this  --continue IV doxycycline --OK to continue IMIPENEM for now --I discussed with Dr. Alvy Bimler and I would prefer NOT to give a colony stimulating drug such as granix since I think it could obscure patients response to current therapy  --if she does not respond to current therapy we will need to get CT chest abdomen and pelvis  I will get RMSF and Ehrlichia abs today (keeping in mind it is could very well be  TOO early for either to be positive)  I spent greater than 80 minutes with the patient including greater than 50% of time in face to face counsel of the patient, her husband and patients brother (ENT MD in [place]() and in coordination of her care.       03/04/2016, 5:00 PM   Thank you so much for this interesting consult  [place] [facility] [phone] (pager) [phone] (office) 03/04/2016, 5:00 PM  [place] 03/04/2016, 5:00 PM

## 2016-03-04 NOTE — Progress Notes (Signed)
[place] NOTE  Patient Care Team: Donald Prose, MD as PCP - General (Family Medicine)  CHIEF COMPLAINTS/PURPOSE OF CONSULTATION:  Pancytopenia, recent infection  HISTORY OF PRESENTING ILLNESS:  Kim Gonzales 45 y.o. female is here because of pancytopenia.  She was found to have abnormal CBC from admission labs from the emergency room. This is a patient who is otherwise healthy apart from iron deficiency anemia due to menorrhagia Recently, she had urinary tract infection and was placed on multiple different courses of antibiotics. Her last course of antibiotic was Bactrim She presented to the emergency department after presentation with fever and flank pain. The patient also endorses recent tick bites and exposure to close proximity to mice at home Since admission to the hospital, she was noted to have pancytopenia which is new compared to her baseline white blood cell count In December 2016, she had mild microcytic anemia but with normal white blood cell count of 5.5 implant count 288,000 On admission on 03/03/2016, blood cell count was 2.0 with mild microcytosis but normal platelet count. This morning, CBC show pancytopenia with white count as low as 0.8 and platelet count of 112,000 In addition, she also was noted to have elevated PT and APTT She denies recent bruising/bleeding, such as spontaneous epistaxis, hematuria, melena or hematochezia The patient denies history of liver disease, exposure to heparin, history of cardiac murmur/prior cardiovascular surgery or recent new medications She denies prior blood or platelet transfusions She denies recent skin rashes or joint pain. No abnormal change in bowel habits, neurological deficits or weight loss. She complained of headache since admission  MEDICAL HISTORY:  Past Medical History  Diagnosis Date  . Anemia     SURGICAL HISTORY: Past Surgical History  Procedure Laterality Date  . Knee arthroscopy    .  Cesarean section      x3  . Shoulder arthroscopy    . Wisdom tooth extraction    . Tubal ligation      pptl  . Dilitation & currettage/hystroscopy with hydrothermal ablation N/A 10/10/2015    Procedure: DILATATION & CURETTAGE/HYSTEROSCOPY WITH HYDROTHERMAL ABLATION;  Surgeon: Crawford Givens, MD;  Location: [facility];  Service: Gynecology;  Laterality: N/A;  . Dilation and curettage of uterus      SOCIAL HISTORY: Social History   Social History  . Marital Status: Married    Spouse Name: N/A  . Number of Children: N/A  . Years of Education: N/A   Occupational History  . Not on file.   Social History Main Topics  . Smoking status: Never Smoker   . Smokeless tobacco: Not on file  . Alcohol Use: No  . Drug Use: No  . Sexual Activity: Not on file   Other Topics Concern  . Not on file   Social History Narrative    FAMILY HISTORY: Family History  Problem Relation Age of Onset  . Kidney nephrosis Neg Hx     ALLERGIES:  has No Known Allergies.  MEDICATIONS:  Current Facility-Administered Medications  Medication Dose Route Frequency Provider Last Rate Last Dose  . 0.9 %  sodium chloride infusion   Intravenous STAT Fredia Sorrow, MD      . 0.9 %  sodium chloride infusion   Intravenous Continuous Belkys A Regalado, MD 100 mL/hr at 03/04/16 0836    . acetaminophen (TYLENOL) tablet 650 mg  650 mg Oral Q6H PRN Vianne Bulls, MD   650 mg at 03/04/16 0210   Or  . acetaminophen (TYLENOL)  suppository 650 mg  650 mg Rectal Q6H PRN Vianne Bulls, MD      . bisacodyl (DULCOLAX) EC tablet 5 mg  5 mg Oral Daily PRN Vianne Bulls, MD      . doxycycline (VIBRAMYCIN) 100 mg in dextrose 5 % 250 mL IVPB  100 mg Intravenous Q12H Belkys A Regalado, MD      . enoxaparin (LOVENOX) injection 40 mg  40 mg Subcutaneous Q24H Vianne Bulls, MD   40 mg at 03/03/16 2214  . ferrous sulfate tablet 325 mg  325 mg Oral Q breakfast Vianne Bulls, MD   325 mg at 03/04/16 0747  . HYDROcodone-acetaminophen  (NORCO/VICODIN) 5-325 MG per tablet 1 tablet  1 tablet Oral Q4H PRN Vianne Bulls, MD   1 tablet at 03/04/16 0747  . imipenem-cilastatin (PRIMAXIN) 500 mg in sodium chloride 0.9 % 100 mL IVPB  500 mg Intravenous Q6H Baltazar Najjar [place], RPH      . ondansetron (ZOFRAN) tablet 4 mg  4 mg Oral Q6H PRN Vianne Bulls, MD       Or  . ondansetron (ZOFRAN) injection 4 mg  4 mg Intravenous Q6H PRN Ilene Qua Opyd, MD      . polyethylene glycol (MIRALAX / GLYCOLAX) packet 17 g  17 g Oral BID Belkys A Regalado, MD      . sodium chloride 0.9 % bolus 1,000 mL  1,000 mL Intravenous Once Belkys A Regalado, MD       And  . sodium chloride 0.9 % bolus 250 mL  250 mL Intravenous Once Belkys A Regalado, MD        REVIEW OF SYSTEMS:   Constitutional: Denies fevers, chills or abnormal night sweats Eyes: Denies blurriness of vision, double vision or watery eyes Ears, nose, mouth, throat, and face: Denies mucositis or sore throat Respiratory: Denies cough, dyspnea or wheezes Cardiovascular: Denies palpitation, chest discomfort or lower extremity swelling Gastrointestinal:  Denies nausea, heartburn or change in bowel habits Skin: Denies abnormal skin rashes Lymphatics: Denies new lymphadenopathy or easy bruising Neurological:Denies numbness, tingling or new weaknesses Behavioral/Psych: Mood is stable, no new changes  All other systems were reviewed with the patient and are negative.  PHYSICAL EXAMINATION: ECOG PERFORMANCE STATUS: 1 - Symptomatic but completely ambulatory  Filed Vitals:   03/04/16 0310 03/04/16 0502  BP:  93/58  Pulse:  78  Temp: 98.6 F (37 C) 98.6 F (37 C)  Resp:  16   Filed Weights   03/03/16 1113 03/03/16 1823  Weight: 157 lb 9.6 oz (71.487 kg) 158 lb 4.6 oz (71.8 kg)    GENERAL:alert, no distress and comfortable SKIN: skin color, texture, turgor are normal, no rashes or significant lesions EYES: normal, conjunctiva are pink and non-injected, sclera clear OROPHARYNX:no  exudate, no erythema and lips, buccal mucosa, and tongue normal  NECK: supple, thyroid normal size, non-tender, without nodularity LYMPH:  no palpable lymphadenopathy in the cervical, axillary or inguinal LUNGS: clear to auscultation and percussion with normal breathing effort HEART: regular rate & rhythm and no murmurs and no lower extremity edema ABDOMEN:abdomen soft, non-tender and normal bowel sounds Musculoskeletal:no cyanosis of digits and no clubbing  PSYCH: alert & oriented x 3 with fluent speech NEURO: no focal motor/sensory deficits  LABORATORY DATA:  I have reviewed the data as listed Recent Results (from the past 2160 hour(s))  Urinalysis, Routine w reflex microscopic (not at [facility])     Status: Abnormal   Collection Time: 03/03/16 12:05  PM  Result Value Ref Range   Color, Urine YELLOW YELLOW   APPearance CLEAR CLEAR   Specific Gravity, Urine 1.011 1.005 - 1.030   pH 6.0 5.0 - 8.0   Glucose, UA NEGATIVE NEGATIVE mg/dL   Hgb urine dipstick MODERATE (A) NEGATIVE   Bilirubin Urine NEGATIVE NEGATIVE   Ketones, ur NEGATIVE NEGATIVE mg/dL   Protein, ur NEGATIVE NEGATIVE mg/dL   Nitrite NEGATIVE NEGATIVE   Leukocytes, UA NEGATIVE NEGATIVE  Pregnancy, urine     Status: None   Collection Time: 03/03/16 12:05 PM  Result Value Ref Range   Preg Test, Ur NEGATIVE NEGATIVE    Comment:        THE SENSITIVITY OF THIS METHODOLOGY IS >20 mIU/mL.   Urine microscopic-add on     Status: Abnormal   Collection Time: 03/03/16 12:05 PM  Result Value Ref Range   Squamous Epithelial / LPF 0-5 (A) NONE SEEN   WBC, UA 0-5 0 - 5 WBC/hpf   RBC / HPF 6-30 0 - 5 RBC/hpf   Bacteria, UA MANY (A) NONE SEEN   Urine-Other MUCOUS PRESENT   Culture, blood (Routine X 2) w Reflex to ID Panel     Status: None (Preliminary result)   Collection Time: 03/03/16 12:20 PM  Result Value Ref Range   Specimen Description BLOOD RIGHT ANTECUBITAL    Special Requests BOTTLES DRAWN AEROBIC AND ANAEROBIC  5CC     Culture PENDING    Report Status PENDING   I-Stat CG4 Lactic Acid, ED     Status: None   Collection Time: 03/03/16 12:27 PM  Result Value Ref Range   Lactic Acid, Venous 1.10 0.5 - 2.0 mmol/L  CBC with Differential/Platelet     Status: Abnormal   Collection Time: 03/03/16 12:30 PM  Result Value Ref Range   WBC 2.0 (L) 4.0 - 10.5 K/uL   RBC 4.96 3.87 - 5.11 MIL/uL   Hemoglobin 13.2 12.0 - 15.0 g/dL   HCT 39.5 36.0 - 46.0 %   MCV 79.6 78.0 - 100.0 fL   MCH 26.6 26.0 - 34.0 pg   MCHC 33.4 30.0 - 36.0 g/dL   RDW 15.1 11.5 - 15.5 %   Platelets 172 150 - 400 K/uL   Neutrophils Relative % 76 %   Neutro Abs 1.5 (L) 1.7 - 7.7 K/uL   Lymphocytes Relative 17 %   Lymphs Abs 0.3 (L) 0.7 - 4.0 K/uL   Monocytes Relative 7 %   Monocytes Absolute 0.1 0.1 - 1.0 K/uL   Eosinophils Relative 0 %   Eosinophils Absolute 0.0 0.0 - 0.7 K/uL   Basophils Relative 0 %   Basophils Absolute 0.0 0.0 - 0.1 K/uL  Comprehensive metabolic panel     Status: Abnormal   Collection Time: 03/03/16 12:30 PM  Result Value Ref Range   Sodium 134 (L) 135 - 145 mmol/L   Potassium 3.6 3.5 - 5.1 mmol/L   Chloride 105 101 - 111 mmol/L   CO2 24 22 - 32 mmol/L   Glucose, Bld 103 (H) 65 - 99 mg/dL   BUN 8 6 - 20 mg/dL   Creatinine, Ser 1.03 (H) 0.44 - 1.00 mg/dL   Calcium 9.1 8.9 - 10.3 mg/dL   Total Protein 7.6 6.5 - 8.1 g/dL   Albumin 4.4 3.5 - 5.0 g/dL   AST 44 (H) 15 - 41 U/L   ALT 35 14 - 54 U/L   Alkaline Phosphatase 38 38 - 126 U/L   Total Bilirubin  0.3 0.3 - 1.2 mg/dL   GFR calc non Af Amer >60 >60 mL/min   GFR calc Af Amer >60 >60 mL/min    Comment: (NOTE) The eGFR has been calculated using the CKD EPI equation. This calculation has not been validated in all clinical situations. eGFR's persistently <60 mL/min signify possible Chronic Kidney Disease.    Anion gap 5 5 - 15  Lipase, blood     Status: None   Collection Time: 03/03/16 12:30 PM  Result Value Ref Range   Lipase 43 11 - 51 U/L  Culture,  blood (Routine X 2) w Reflex to ID Panel     Status: None (Preliminary result)   Collection Time: 03/03/16  2:05 PM  Result Value Ref Range   Specimen Description BLOOD RIGHT HAND    Special Requests BOTTLES DRAWN AEROBIC AND ANAEROBIC  5CC    Culture PENDING    Report Status PENDING   Lactic acid, plasma     Status: None   Collection Time: 03/03/16  7:13 PM  Result Value Ref Range   Lactic Acid, Venous 0.8 0.5 - 2.0 mmol/L  Procalcitonin     Status: None   Collection Time: 03/03/16  7:13 PM  Result Value Ref Range   Procalcitonin 0.16 ng/mL    Comment:        Interpretation: PCT (Procalcitonin) <= 0.5 ng/mL: Systemic infection (sepsis) is not likely. Local bacterial infection is possible. (NOTE)         ICU PCT Algorithm               Non ICU PCT Algorithm    ----------------------------     ------------------------------         PCT < 0.25 ng/mL                 PCT < 0.1 ng/mL     Stopping of antibiotics            Stopping of antibiotics       strongly encouraged.               strongly encouraged.    ----------------------------     ------------------------------       PCT level decrease by               PCT < 0.25 ng/mL       >= 80% from peak PCT       OR PCT 0.25 - 0.5 ng/mL          Stopping of antibiotics                                             encouraged.     Stopping of antibiotics           encouraged.    ----------------------------     ------------------------------       PCT level decrease by              PCT >= 0.25 ng/mL       < 80% from peak PCT        AND PCT >= 0.5 ng/mL            Continuin g antibiotics  encouraged.       Continuing antibiotics            encouraged.    ----------------------------     ------------------------------     PCT level increase compared          PCT > 0.5 ng/mL         with peak PCT AND          PCT >= 0.5 ng/mL             Escalation of antibiotics                                           strongly encouraged.      Escalation of antibiotics        strongly encouraged.   Protime-INR     Status: Abnormal   Collection Time: 03/03/16  7:13 PM  Result Value Ref Range   Prothrombin Time 15.3 (H) 11.6 - 15.2 seconds   INR 1.20 0.00 - 1.49  APTT     Status: None   Collection Time: 03/03/16  7:13 PM  Result Value Ref Range   aPTT 31 24 - 37 seconds  Lactic acid, plasma     Status: Abnormal   Collection Time: 03/03/16  9:50 PM  Result Value Ref Range   Lactic Acid, Venous 2.3 (HH) 0.5 - 2.0 mmol/L    Comment: CRITICAL RESULT CALLED TO, READ BACK BY AND VERIFIED WITH: DUMAS,M AT 2232 ON 03/03/16 BY MOSLEY,J   CBC with Differential     Status: Abnormal   Collection Time: 03/04/16  5:13 AM  Result Value Ref Range   WBC 0.9 (LL) 4.0 - 10.5 K/uL    Comment: CRITICAL RESULT CALLED TO, READ BACK BY AND VERIFIED WITH: N. DUMAS RN AT 0645 ON 05.10.17 BY SHUEA    RBC 4.27 3.87 - 5.11 MIL/uL   Hemoglobin 11.5 (L) 12.0 - 15.0 g/dL   HCT 33.9 (L) 36.0 - 46.0 %   MCV 79.4 78.0 - 100.0 fL   MCH 26.9 26.0 - 34.0 pg   MCHC 33.9 30.0 - 36.0 g/dL   RDW 15.0 11.5 - 15.5 %   Platelets 120 (L) 150 - 400 K/uL   Neutrophils Relative % 74 %   Lymphocytes Relative 21 %   Monocytes Relative 5 %   Eosinophils Relative 0 %   Basophils Relative 0 %   Neutro Abs 0.7 (L) 1.7 - 7.7 K/uL   Lymphs Abs 0.2 (L) 0.7 - 4.0 K/uL   Monocytes Absolute 0.0 (L) 0.1 - 1.0 K/uL   Eosinophils Absolute 0.0 0.0 - 0.7 K/uL   Basophils Absolute 0.0 0.0 - 0.1 K/uL   WBC Morphology WHITE COUNT CONFIRMED ON SMEAR   Comprehensive metabolic panel     Status: Abnormal   Collection Time: 03/04/16  5:13 AM  Result Value Ref Range   Sodium 137 135 - 145 mmol/L   Potassium 3.6 3.5 - 5.1 mmol/L   Chloride 110 101 - 111 mmol/L   CO2 18 (L) 22 - 32 mmol/L   Glucose, Bld 106 (H) 65 - 99 mg/dL   BUN 7 6 - 20 mg/dL   Creatinine, Ser 0.85 0.44 - 1.00 mg/dL   Calcium 8.0 (L) 8.9 - 10.3 mg/dL   Total Protein 5.5  (L) 6.5 - 8.1 g/dL   Albumin 3.2 (L) 3.5 - 5.0 g/dL  AST 54 (H) 15 - 41 U/L   ALT 45 14 - 54 U/L   Alkaline Phosphatase 44 38 - 126 U/L   Total Bilirubin 0.5 0.3 - 1.2 mg/dL   GFR calc non Af Amer >60 >60 mL/min   GFR calc Af Amer >60 >60 mL/min    Comment: (NOTE) The eGFR has been calculated using the CKD EPI equation. This calculation has not been validated in all clinical situations. eGFR's persistently <60 mL/min signify possible Chronic Kidney Disease.    Anion gap 9 5 - 15  Glucose, capillary     Status: Abnormal   Collection Time: 03/04/16  7:22 AM  Result Value Ref Range   Glucose-Capillary 107 (H) 65 - 99 mg/dL   Comment 1 Notify RN   CBC with Differential     Status: Abnormal   Collection Time: 03/04/16  8:00 AM  Result Value Ref Range   WBC 0.8 (LL) 4.0 - 10.5 K/uL    Comment: CRITICAL VALUE NOTED.  VALUE IS CONSISTENT WITH PREVIOUSLY REPORTED AND CALLED VALUE.   RBC 4.46 3.87 - 5.11 MIL/uL   Hemoglobin 11.9 (L) 12.0 - 15.0 g/dL   HCT 35.7 (L) 36.0 - 46.0 %   MCV 80.0 78.0 - 100.0 fL   MCH 26.7 26.0 - 34.0 pg   MCHC 33.3 30.0 - 36.0 g/dL   RDW 15.0 11.5 - 15.5 %   Platelets 112 (L) 150 - 400 K/uL    Comment: CONSISTENT WITH PREVIOUS RESULT   Other A %   Neutrophils Relative % 62 %   Lymphocytes Relative 34 %   Monocytes Relative 3 %   Eosinophils Relative 0 %   Basophils Relative 1 %   Neutro Abs 0.5 (L) 1.7 - 7.7 K/uL   Lymphs Abs 0.3 (L) 0.7 - 4.0 K/uL   Monocytes Absolute 0.0 (L) 0.1 - 1.0 K/uL   Eosinophils Absolute 0.0 0.0 - 0.7 K/uL   Basophils Absolute 0.0 0.0 - 0.1 K/uL   WBC Morphology VACUOLATED NEUTROPHILS   Comprehensive metabolic panel     Status: Abnormal   Collection Time: 03/04/16  8:00 AM  Result Value Ref Range   Sodium 135 135 - 145 mmol/L   Potassium 4.1 3.5 - 5.1 mmol/L   Chloride 107 101 - 111 mmol/L   CO2 21 (L) 22 - 32 mmol/L   Glucose, Bld 101 (H) 65 - 99 mg/dL   BUN 7 6 - 20 mg/dL   Creatinine, Ser 0.92 0.44 - 1.00 mg/dL    Calcium 8.2 (L) 8.9 - 10.3 mg/dL   Total Protein 5.5 (L) 6.5 - 8.1 g/dL   Albumin 3.3 (L) 3.5 - 5.0 g/dL   AST 55 (H) 15 - 41 U/L   ALT 46 14 - 54 U/L   Alkaline Phosphatase 43 38 - 126 U/L   Total Bilirubin 0.5 0.3 - 1.2 mg/dL   GFR calc non Af Amer >60 >60 mL/min   GFR calc Af Amer >60 >60 mL/min    Comment: (NOTE) The eGFR has been calculated using the CKD EPI equation. This calculation has not been validated in all clinical situations. eGFR's persistently <60 mL/min signify possible Chronic Kidney Disease.    Anion gap 7 5 - 15  Lactic acid, plasma     Status: None   Collection Time: 03/04/16  8:00 AM  Result Value Ref Range   Lactic Acid, Venous 0.8 0.5 - 2.0 mmol/L  Procalcitonin     Status: None  Collection Time: 03/04/16  8:00 AM  Result Value Ref Range   Procalcitonin 0.18 ng/mL    Comment:        Interpretation: PCT (Procalcitonin) <= 0.5 ng/mL: Systemic infection (sepsis) is not likely. Local bacterial infection is possible. (NOTE)         ICU PCT Algorithm               Non ICU PCT Algorithm    ----------------------------     ------------------------------         PCT < 0.25 ng/mL                 PCT < 0.1 ng/mL     Stopping of antibiotics            Stopping of antibiotics       strongly encouraged.               strongly encouraged.    ----------------------------     ------------------------------       PCT level decrease by               PCT < 0.25 ng/mL       >= 80% from peak PCT       OR PCT 0.25 - 0.5 ng/mL          Stopping of antibiotics                                             encouraged.     Stopping of antibiotics           encouraged.    ----------------------------     ------------------------------       PCT level decrease by              PCT >= 0.25 ng/mL       < 80% from peak PCT        AND PCT >= 0.5 ng/mL            Continuin g antibiotics                                              encouraged.       Continuing antibiotics             encouraged.    ----------------------------     ------------------------------     PCT level increase compared          PCT > 0.5 ng/mL         with peak PCT AND          PCT >= 0.5 ng/mL             Escalation of antibiotics                                          strongly encouraged.      Escalation of antibiotics        strongly encouraged.   Protime-INR     Status: Abnormal   Collection Time: 03/04/16  8:00 AM  Result Value Ref Range   Prothrombin Time 16.8 (H) 11.6 - 15.2 seconds   INR  1.35 0.00 - 1.49  APTT     Status: Abnormal   Collection Time: 03/04/16  8:00 AM  Result Value Ref Range   aPTT 40 (H) 24 - 37 seconds    Comment:        IF BASELINE aPTT IS ELEVATED, SUGGEST PATIENT RISK ASSESSMENT BE USED TO DETERMINE APPROPRIATE ANTICOAGULANT THERAPY.   Save smear     Status: None   Collection Time: 03/04/16  8:00 AM  Result Value Ref Range   Smear Review SMEAR STAINED AND AVAILABLE FOR REVIEW     RADIOGRAPHIC STUDIES: I have personally reviewed the radiological images as listed and agreed with the findings in the report. Dg Chest Port 1 View  03/04/2016  CLINICAL DATA:  Sepsis. EXAM: PORTABLE CHEST 1 VIEW COMPARISON:  None. FINDINGS: The heart size and mediastinal contours are within normal limits. There is no evidence of pulmonary edema, consolidation, pneumothorax, nodule or pleural fluid. The visualized skeletal structures are unremarkable. IMPRESSION: No active disease. Electronically Signed   By: Aletta Edouard M.D.   On: 03/04/2016 08:34   Ct Renal Stone Study  03/03/2016  CLINICAL DATA:  Right flank pain for 1 month EXAM: CT ABDOMEN AND PELVIS WITHOUT CONTRAST TECHNIQUE: Multidetector CT imaging of the abdomen and pelvis was performed following the standard protocol without IV contrast. COMPARISON:  Pelvic ultrasound 12/28/2014 FINDINGS: Lower chest:  The lung bases are unremarkable. Hepatobiliary: Unenhanced liver shows no biliary ductal dilatation. No calcified  gallstones are noted within gallbladder. No CBD dilatation. Pancreas: Unenhanced pancreas is unremarkable. Spleen: Unenhanced spleen is unremarkable. Adrenals/Urinary Tract: No adrenal gland mass. Unenhanced kidneys are symmetrical in size. Nephrolithiasis. No hydronephrosis or hydroureter. No calcified ureteral calculi are noted. No calcified calculi are noted within under distended urinary bladder. Stomach/Bowel: There is no gastric outlet obstruction. The study is limited without IV or oral contrast. No small bowel obstruction. Abundant stool is noted in right colon and transverse colon. Abundant stool is noted within cecum. There is a low lying cecum. Normal appendix clearly visualized in coronal image 36. Moderate stool noted within and descending colon. Moderate stool noted within distal sigmoid colon. Moderate stool and gas noted within rectum. Vascular/Lymphatic: There is no aortic aneurysm. Pelvic phleboliths are noted. No retroperitoneal or mesenteric adenopathy. Reproductive: There is again noted retroverted uterus. There is anterior pedunculated uterine fibroid measures about 2.2 cm. No adnexal masses noted. No pelvic free fluid. Other: No ascites or free air. Musculoskeletal: No destructive bony lesions are noted. Sagittal images of the spine shows significant disc space flattening with vacuum disc phenomenon at L4-L5 level. Mild disc space flattening minimal posterior disc bulge at L3-L4 level. Mild posterior disc bulge at L5-S1 level. No destructive bony lesions are noted within pelvis. IMPRESSION: 1. There is no evidence of nephrolithiasis. No hydronephrosis or hydroureter. No calcified ureteral calculi. 2. Abundant stool noted in right colon and redundant transverse colon. There is a low lying cecum. Abundant stool noted within cecum. No pericecal inflammation. Normal appendix. 3. Retroverted uterus. Anterior uterine fibroid measures at least 2.2 cm. No adnexal mass. No pelvic free fluid. 4.  Degenerative changes lumbar spine. 5. No small bowel obstruction. Electronically Signed   By: Lahoma Crocker M.D.   On: 03/03/2016 13:33   Peripheral smear is reviewed. Absolute leukopenia is noted. Very mild thrombocytopenia without platelet clumping. Very rare schistocytes seen ASSESSMENT & PLAN  Pancytopenia, likely related to sepsis The most likely cause of pancytopenia is due to bone marrow suppression from recent  urinary tract infection/sepsis and side effects from Bactrim I have ordered vitamin B-12 to follow She does not require transfusion. Monitor carefully.  Neutropenic fever, likely UTI source This is likely due to bone marrow suppression. I recommend G-CSF daily until [place] greater than 1.5  Mild coagulopathy, DIC versus related to vitamin K deficiency from recurrent antibiotic therapy She had received recurrent antibiotic therapy and is doing dietary modification to lose weight I have ordered fibrinogen level to evaluate whether this is consumptive DIC versus vitamin K deficiency If this is due to mild DIC, treatment would be to treat underlying disease, which is infection I will hold off mixing study until further tests are available  Headaches, likely related to hypotension She is receiving multiple bolus IV fluids Continue conservative management with Tylenol as needed for headaches  Discharge planning Not ready for discharge due to neutropenia Will follow tomorrow

## 2016-03-04 NOTE — Progress Notes (Signed)
Pharmacy Antibiotic Note  Kim Gonzales is a 45 y.o. female admitted on 03/03/2016 with sepsis, febrile neutropenia.  She has been treated for UTI with courses of Keflex, Cipro, & Bactrim over past 10 days.  She now presents to the ED with continued symptoms.  She was initially started on Levaquin, however therapy is being broadened as pt worsening.  Pharmacy has been consulted for Vanc/Zosyn dosing.  Of note per PMH recent urine cx + Ecoli resistant to PCN, cephalosporins.  Discussed patient with Dr Tyrell Antonio, change Zosyn/Levaquin to Primaxin.  Doxycycline is also being added for possible tick-borne illness.  ID is also being consulted.   03/04/2016:   Febrile Tm 102.69F  Neutropenic (WBC 0.9, ANC 0.7)  Renal function at baseline.  Estimated CrCl ~ 2ml/min.   Procalcitonin/Lactic acid levels mildly elevated, increasing  Hypotensive- BP 93/58, HR wnl  No seizure hx noted  Plan:  Primaxin 500mg  IV q6h  Doxycycline 100mg  IV q12h per MD  Vancomycin 750mg  IV q8h  Monitor renal function and cx data  De-escalate antibiotics once blood cx data available (or possibly d/c Vanc sooner if OK with ID)  Height: 5' 7.5" (171.5 cm) Weight: 158 lb 4.6 oz (71.8 kg) IBW/kg (Calculated) : 62.75  Temp (24hrs), Avg:99.3 F (37.4 C), Min:98.3 F (36.8 C), Max:102.8 F (39.3 C)   Recent Labs Lab 03/03/16 1227 03/03/16 1230 03/03/16 1913 03/03/16 2150 03/04/16 0513  WBC  --  2.0*  --   --  0.9*  CREATININE  --  1.03*  --   --  0.85  LATICACIDVEN 1.10  --  0.8 2.3*  --     Estimated Creatinine Clearance: 82 mL/min (by C-G formula based on Cr of 0.85).    No Known Allergies  Antimicrobials this admission: Vanc 5/10 >>  Zosyn 5/10 x 1 dose Levaquin 5/9>>5/10 Primaxin 5/9 x1 dose; 5/10>>  Dose adjustments this admission:  Microbiology results: 5/9 BCx: IP 5/9 UCx: sent  *Per admit note, pt had outpt cx Ecoli (R-PCN, cephalosporins; S- Bactrim, FQ, nitrofurantoin)  Thank you for  allowing pharmacy to be a part of this patient's care.  Biagio Borg 03/04/2016 7:36 AM

## 2016-03-04 NOTE — Progress Notes (Signed)
PROGRESS NOTE    Kim Gonzales  [id number] DOB: 1970-12-14 DOA: 03/03/2016 PCP: Lynne Logan, MD  Brief Narrative: 45 y.o. female with medical history significant for iron deficiency anemia and menorrhagia status post D&C who presents to the ED at [facility] for evaluation of fevers, chills, and right flank pain despite 10 days of antibiotic therapy for UTI. She did not seek medical evaluation until approximately 10 days prior to her admission. At that time, the patient was started on empiric therapy with Keflex for suspected UTI. Culture was obtained and grew Escherichia coli, resistant to penicillins and cephalosporins, and sensitive to Bactrim, fluoroquinolones, and Macrodantin. Therapy was switched to ciprofloxacin at that time. Course was completed with no appreciable improvement in her symptoms and she was placed on Bactrim which she has taken for the past 3 days. She was complaining of right side flank pain and lower abdominal pain on admission. She also report headaches. Patient report tick bites and exposure to mice.   Patient febrile, with neutropenic, mild thrombocytopenia.   Assessment & Plan:   Principal Problem:   Pyelonephritis Active Problems:   Leukopenia   Hyponatremia   Anemia, iron deficiency  1-Neutropenic fever;  Patient presents with fever, neutropenia, recently treated for UTI ( Urine culture from PCP grew E coli 25,000 only).  Neutropenic precaution.  Will broad antibiotics, Primaxin, which e coli was sensitive.  Discussed case with Dr Alvy Bimler, who will see patient in consultation. Neutropenia might be related to recent antibiotics exposure vs infection.  CT negative for pyelonephritis.  Will order peripheral smear.   2-Sepsis;  Presents with fever, neutropenia, source unclear, UTI, vs tick exposure.  Start doxy and continue with Primaxin for now.  Follow blood culture.  Check chest x ray.  ID consulted duue to exposure to mice and tick  bite.  IV fluids, transfer to step down unit.   3-constipation; IV fluids.  4-anemia; continue with ferrous sulfate.    DVT prophylaxis: Lovenox.  Code Status: full code.  Family Communication: care discussed with patient  Disposition Plan: transfer to step down unit for treatment and evaluation of sepsis.    Consultants:   ID   Hematology   Procedures:   none  Antimicrobials:   Zosyn  Levaquin.   Vancomycin.    Subjective: She report ticks bite a week ago. Also she has problems with mice at home, she had to kill some mice and clean it after. She report headaches frontal headaches.    Objective: Filed Vitals:   03/03/16 2048 03/04/16 0206 03/04/16 0310 03/04/16 0502  BP: 131/69   93/58  Pulse: 85   78  Temp: 98.8 F (37.1 C) 102.8 F (39.3 C) 98.6 F (37 C) 98.6 F (37 C)  TempSrc: Oral Oral Oral Oral  Resp: 20   16  Height:      Weight:      SpO2: 100%   98%   No intake or output data in the 24 hours ending 03/04/16 0752 Filed Weights   03/03/16 1113 03/03/16 1823  Weight: 71.487 kg (157 lb 9.6 oz) 71.8 kg (158 lb 4.6 oz)    Examination:  General exam: Appears calm and comfortable  Respiratory system: Clear to auscultation. Respiratory effort normal. Cardiovascular system: S1 & S2 heard, RRR. No JVD, murmurs, rubs, gallops or clicks. No pedal edema. Gastrointestinal system: Abdomen is nondistended, soft and nontender. No organomegaly or masses felt. Normal bowel sounds heard. Central nervous system: Alert and oriented. No focal  neurological deficits. Extremities: Symmetric 5 x 5 power. Skin: No rashes, lesions or ulcers    Data Reviewed: I have personally reviewed following labs and imaging studies  CBC:  Recent Labs Lab 03/03/16 1230 03/04/16 0513  WBC 2.0* 0.9*  NEUTROABS 1.5* 0.7*  HGB 13.2 11.5*  HCT 39.5 33.9*  MCV 79.6 79.4  PLT 172 123456*   Basic Metabolic Panel:  Recent Labs Lab 03/03/16 1230 03/04/16 0513  NA 134* 137    K 3.6 3.6  CL 105 110  CO2 24 18*  GLUCOSE 103* 106*  BUN 8 7  CREATININE 1.03* 0.85  CALCIUM 9.1 8.0*   GFR: Estimated Creatinine Clearance: 82 mL/min (by C-G formula based on Cr of 0.85). Liver Function Tests:  Recent Labs Lab 03/03/16 1230 03/04/16 0513  AST 44* 54*  ALT 35 45  ALKPHOS 38 44  BILITOT 0.3 0.5  PROT 7.6 5.5*  ALBUMIN 4.4 3.2*    Recent Labs Lab 03/03/16 1230  LIPASE 43   No results for input(s): AMMONIA in the last 168 hours. Coagulation Profile:  Recent Labs Lab 03/03/16 1913  INR 1.20   Cardiac Enzymes: No results for input(s): CKTOTAL, CKMB, CKMBINDEX, TROPONINI in the last 168 hours. BNP (last 3 results) No results for input(s): PROBNP in the last 8760 hours. HbA1C: No results for input(s): HGBA1C in the last 72 hours. CBG:  Recent Labs Lab 03/04/16 0722  GLUCAP 107*   Lipid Profile: No results for input(s): CHOL, HDL, LDLCALC, TRIG, CHOLHDL, LDLDIRECT in the last 72 hours. Thyroid Function Tests: No results for input(s): TSH, T4TOTAL, FREET4, T3FREE, THYROIDAB in the last 72 hours. Anemia Panel: No results for input(s): VITAMINB12, FOLATE, FERRITIN, TIBC, IRON, RETICCTPCT in the last 72 hours. Urine analysis:    Component Value Date/Time   COLORURINE YELLOW 03/03/2016 [place] 03/03/2016 1205   LABSPEC 1.011 03/03/2016 1205   PHURINE 6.0 03/03/2016 1205   GLUCOSEU NEGATIVE 03/03/2016 1205   HGBUR MODERATE* 03/03/2016 1205   [place] 03/03/2016 1205   KETONESUR NEGATIVE 03/03/2016 1205   PROTEINUR NEGATIVE 03/03/2016 1205   NITRITE NEGATIVE 03/03/2016 1205   [place] 03/03/2016 1205   Sepsis Labs:  Recent Labs Lab 03/03/16 1227 03/03/16 1913 03/03/16 2150  PROCALCITON  --  0.16  --   LATICACIDVEN 1.10 0.8 2.3*    Recent Results (from the past 240 hour(s))  Culture, blood (Routine X 2) w Reflex to ID Panel     Status: None (Preliminary result)   Collection Time:  03/03/16 12:20 PM  Result Value Ref Range Status   Specimen Description BLOOD RIGHT ANTECUBITAL  Final   Special Requests BOTTLES DRAWN AEROBIC AND ANAEROBIC  5CC  Final   Culture PENDING  Incomplete   Report Status PENDING  Incomplete  Culture, blood (Routine X 2) w Reflex to ID Panel     Status: None (Preliminary result)   Collection Time: 03/03/16  2:05 PM  Result Value Ref Range Status   Specimen Description BLOOD RIGHT HAND  Final   Special Requests BOTTLES DRAWN AEROBIC AND ANAEROBIC  5CC  Final   Culture PENDING  Incomplete   Report Status PENDING  Incomplete         Radiology Studies: Ct Renal Stone Study  03/03/2016  CLINICAL DATA:  Right flank pain for 1 month EXAM: CT ABDOMEN AND PELVIS WITHOUT CONTRAST TECHNIQUE: Multidetector CT imaging of the abdomen and pelvis was performed following the standard protocol without IV contrast. COMPARISON:  Pelvic ultrasound 12/28/2014 FINDINGS: Lower chest:  The lung bases are unremarkable. Hepatobiliary: Unenhanced liver shows no biliary ductal dilatation. No calcified gallstones are noted within gallbladder. No CBD dilatation. Pancreas: Unenhanced pancreas is unremarkable. Spleen: Unenhanced spleen is unremarkable. Adrenals/Urinary Tract: No adrenal gland mass. Unenhanced kidneys are symmetrical in size. Nephrolithiasis. No hydronephrosis or hydroureter. No calcified ureteral calculi are noted. No calcified calculi are noted within under distended urinary bladder. Stomach/Bowel: There is no gastric outlet obstruction. The study is limited without IV or oral contrast. No small bowel obstruction. Abundant stool is noted in right colon and transverse colon. Abundant stool is noted within cecum. There is a low lying cecum. Normal appendix clearly visualized in coronal image 36. Moderate stool noted within and descending colon. Moderate stool noted within distal sigmoid colon. Moderate stool and gas noted within rectum. Vascular/Lymphatic: There is no  aortic aneurysm. Pelvic phleboliths are noted. No retroperitoneal or mesenteric adenopathy. Reproductive: There is again noted retroverted uterus. There is anterior pedunculated uterine fibroid measures about 2.2 cm. No adnexal masses noted. No pelvic free fluid. Other: No ascites or free air. Musculoskeletal: No destructive bony lesions are noted. Sagittal images of the spine shows significant disc space flattening with vacuum disc phenomenon at L4-L5 level. Mild disc space flattening minimal posterior disc bulge at L3-L4 level. Mild posterior disc bulge at L5-S1 level. No destructive bony lesions are noted within pelvis. IMPRESSION: 1. There is no evidence of nephrolithiasis. No hydronephrosis or hydroureter. No calcified ureteral calculi. 2. Abundant stool noted in right colon and redundant transverse colon. There is a low lying cecum. Abundant stool noted within cecum. No pericecal inflammation. Normal appendix. 3. Retroverted uterus. Anterior uterine fibroid measures at least 2.2 cm. No adnexal mass. No pelvic free fluid. 4. Degenerative changes lumbar spine. 5. No small bowel obstruction. Electronically Signed   By: Lahoma Crocker M.D.   On: 03/03/2016 13:33        Scheduled Meds: . sodium chloride   Intravenous STAT  . enoxaparin (LOVENOX) injection  40 mg Subcutaneous Q24H  . ferrous sulfate  325 mg Oral Q breakfast  . levofloxacin (LEVAQUIN) IV  750 mg Intravenous Q24H  . piperacillin-tazobactam  3.375 g Intravenous Once  . sodium chloride  1,000 mL Intravenous Once   And  . sodium chloride  1,000 mL Intravenous Once   And  . sodium chloride  250 mL Intravenous Once  . vancomycin  1,000 mg Intravenous Once   Continuous Infusions:       Time spent: 35 minutes.     Elmarie Shiley, MD [facility] Pager [phone]  If 7PM-7AM, please contact night-coverage [email or web address] Password [email or web address] 03/04/2016, 7:52 AM

## 2016-03-05 ENCOUNTER — Encounter (HOSPITAL_COMMUNITY): Payer: Self-pay | Admitting: Radiology

## 2016-03-05 ENCOUNTER — Inpatient Hospital Stay (HOSPITAL_COMMUNITY): Payer: BLUE CROSS/BLUE SHIELD

## 2016-03-05 DIAGNOSIS — D702 Other drug-induced agranulocytosis: Secondary | ICD-10-CM

## 2016-03-05 DIAGNOSIS — R509 Fever, unspecified: Secondary | ICD-10-CM | POA: Insufficient documentation

## 2016-03-05 LAB — CBC WITH DIFFERENTIAL/PLATELET
BASOS ABS: 0 10*3/uL (ref 0.0–0.1)
Basophils Relative: 0 %
EOS ABS: 0 10*3/uL (ref 0.0–0.7)
Eosinophils Relative: 0 %
HCT: 31.8 % — ABNORMAL LOW (ref 36.0–46.0)
Hemoglobin: 10.8 g/dL — ABNORMAL LOW (ref 12.0–15.0)
LYMPHS PCT: 43 %
Lymphs Abs: 0.3 10*3/uL — ABNORMAL LOW (ref 0.7–4.0)
MCH: 26.8 pg (ref 26.0–34.0)
MCHC: 34 g/dL (ref 30.0–36.0)
MCV: 78.9 fL (ref 78.0–100.0)
Monocytes Absolute: 0 10*3/uL — ABNORMAL LOW (ref 0.1–1.0)
Monocytes Relative: 4 %
NEUTROS ABS: 0.3 10*3/uL — AB (ref 1.7–7.7)
NEUTROS PCT: 53 %
PLATELETS: 85 10*3/uL — AB (ref 150–400)
RBC: 4.03 MIL/uL (ref 3.87–5.11)
RDW: 14.5 % (ref 11.5–15.5)
WBC: 0.6 10*3/uL — AB (ref 4.0–10.5)

## 2016-03-05 LAB — URINE CULTURE

## 2016-03-05 LAB — COMPREHENSIVE METABOLIC PANEL
ALT: 50 U/L (ref 14–54)
AST: 60 U/L — ABNORMAL HIGH (ref 15–41)
Albumin: 3.1 g/dL — ABNORMAL LOW (ref 3.5–5.0)
Alkaline Phosphatase: 52 U/L (ref 38–126)
Anion gap: 7 (ref 5–15)
BUN: <5 mg/dL — ABNORMAL LOW (ref 6–20)
CHLORIDE: 109 mmol/L (ref 101–111)
CO2: 21 mmol/L — AB (ref 22–32)
CREATININE: 0.72 mg/dL (ref 0.44–1.00)
Calcium: 8 mg/dL — ABNORMAL LOW (ref 8.9–10.3)
GFR calc non Af Amer: >60 mL/min (ref >60)
Glucose, Bld: 114 mg/dL — ABNORMAL HIGH (ref 65–99)
Potassium: 3.5 mmol/L (ref 3.5–5.1)
SODIUM: 137 mmol/L (ref 135–145)
Total Bilirubin: 0.4 mg/dL (ref 0.3–1.2)
Total Protein: 5.1 g/dL — ABNORMAL LOW (ref 6.5–8.1)

## 2016-03-05 LAB — GLUCOSE, CAPILLARY: Glucose-Capillary: 114 mg/dL — ABNORMAL HIGH (ref 65–99)

## 2016-03-05 MED ORDER — TBO-FILGRASTIM 480 MCG/0.8ML ~~LOC~~ SOSY
480.0000 ug | PREFILLED_SYRINGE | Freq: Every day | SUBCUTANEOUS | Status: DC
  Administered 2016-03-05: 480 ug via SUBCUTANEOUS
  Filled 2016-03-05 (×2): qty 0.8

## 2016-03-05 MED ORDER — ENSURE ENLIVE PO LIQD
237.0000 mL | Freq: Three times a day (TID) | ORAL | Status: DC
  Administered 2016-03-05 – 2016-03-07 (×5): 237 mL via ORAL

## 2016-03-05 MED ORDER — DIATRIZOATE MEGLUMINE & SODIUM 66-10 % PO SOLN
15.0000 mL | ORAL | Status: AC | PRN
Start: 2016-03-05 — End: 2016-03-05
  Administered 2016-03-05 (×2): 15 mL via ORAL
  Filled 2016-03-05 (×3): qty 30

## 2016-03-05 MED ORDER — DOXYCYCLINE HYCLATE 100 MG PO TABS
100.0000 mg | ORAL_TABLET | Freq: Two times a day (BID) | ORAL | Status: DC
  Administered 2016-03-05 – 2016-03-07 (×4): 100 mg via ORAL
  Filled 2016-03-05 (×4): qty 1

## 2016-03-05 MED ORDER — IOPAMIDOL (ISOVUE-300) INJECTION 61%
100.0000 mL | Freq: Once | INTRAVENOUS | Status: AC | PRN
  Administered 2016-03-05: 100 mL via INTRAVENOUS

## 2016-03-05 NOTE — Progress Notes (Signed)
PROGRESS NOTE    Kim Gonzales  [id number] DOB: 03/20/1971 DOA: 03/03/2016 PCP: Lynne Logan, MD  Brief Narrative: 45 y.o. female with medical history significant for iron deficiency anemia and menorrhagia status post D&C who presents to the ED at [facility] for evaluation of fevers, chills, and right flank pain despite 10 days of antibiotic therapy for UTI. She did not seek medical evaluation until approximately 10 days prior to her admission. At that time, the patient was started on empiric therapy with Keflex for suspected UTI. Culture was obtained and grew Escherichia coli, resistant to penicillins and cephalosporins, and sensitive to Bactrim, fluoroquinolones, and Macrodantin. Therapy was switched to ciprofloxacin at that time. Course was completed with no appreciable improvement in her symptoms and she was placed on Bactrim which she has taken for the past 3 days. She was complaining of right side flank pain and lower abdominal pain on admission. She also report headaches. Patient report tick bites and exposure to mice.   Patient febrile, with neutropenic, mild thrombocytopenia.   Assessment & Plan:   Principal Problem:   Neutropenic fever ([place]) Active Problems:   Pyelonephritis   Leukopenia   Hyponatremia   Anemia, iron deficiency   Sepsis ([facility])   Drug-induced neutropenia (H[facility])   LFTs abnormal   Ehrlichiosis  1-Neutropenic fever;  Patient presents with fever, neutropenia, recently treated for UTI ( Urine culture from PCP grew E coli 25,000 only).  Neutropenic precaution.  Continue with primaxin and Doxy day 2.  Discussed case with Dr Alvy Bimler, Neutropenia might be related to recent antibiotics exposure vs infection.  CT negative for pyelonephritis.  Granix daily, discussed with Dr Tommy Medal, ok to use granix if needed.   2-Sepsis;  Presents with fever, neutropenia, source unclear, UTI, vs tick exposure.  Continue with Doxy and primaxin.  Blood culture 5-09   no growth to date.  Blood culture 5-11 Chest x ray negative ID consulted, appreciated Dr Lucianne Lei dam help.  Continue to monitor in the step down unit.  BP above 100, lactic acid normal.  Leptospirosis antigen urine ordered, Ehrlichia and RMSF antibody pending.   3-Constipation; Miralax  4-Anemia; continue with ferrous sulfate.  Follow hb trend. No evidence of active bleeding.   DVT prophylaxis: hold lovenox due to thrombocytopenia, scd ordered.  Code Status: full code.  Family Communication: care discussed with patient and husband who was at bedside.  Disposition Plan: remain in the step down due    Consultants:   ID   Hematology   Procedures:   none  Antimicrobials:  Primaxin 5-10 Doxy 5-10   Subjective: She is feeling better today.  Report headache has improved. Pain medication helping,. Pain today at 4. Yesterday at 8.   Objective: Filed Vitals:   03/05/16 0400 03/05/16 0500 03/05/16 0600 03/05/16 0700  BP: 96/42  105/47   Pulse:      Temp:      TempSrc:      Resp: 12 15 17 15   Height:      Weight:      SpO2: 98% 98% 97% 98%    Intake/Output Summary (Last 24 hours) at 03/05/16 0806 Last data filed at 03/05/16 0600  Gross per 24 hour  Intake   3280 ml  Output   1200 ml  Net   2080 ml   Filed Weights   03/03/16 1113 03/03/16 1823  Weight: 71.487 kg (157 lb 9.6 oz) 71.8 kg (158 lb 4.6 oz)    Examination:  General  exam: Appears calm and comfortable  Respiratory system: Clear to auscultation. Respiratory effort normal. Cardiovascular system: S1 & S2 heard, RRR. No JVD, murmurs, rubs, gallops or clicks. No pedal edema. Gastrointestinal system: Abdomen is nondistended, soft and nontender. No organomegaly or masses felt. Normal bowel sounds heard. Central nervous system: Alert and oriented. No focal neurological deficits. Extremities: Symmetric 5 x 5 power. Skin: No rashes, lesions or ulcers    Data Reviewed: I have personally reviewed following labs  and imaging studies  CBC:  Recent Labs Lab 03/03/16 1230 03/04/16 0513 03/04/16 0800 03/05/16 0309  WBC 2.0* 0.9* 0.8* 0.6*  NEUTROABS 1.5* 0.7* 0.5* 0.3*  HGB 13.2 11.5* 11.9* 10.8*  HCT 39.5 33.9* 35.7* 31.8*  MCV 79.6 79.4 80.0 78.9  PLT 172 120* 112* 85*   Basic Metabolic Panel:  Recent Labs Lab 03/03/16 1230 03/04/16 0513 03/04/16 0800  NA 134* 137 135  K 3.6 3.6 4.1  CL 105 110 107  CO2 24 18* 21*  GLUCOSE 103* 106* 101*  BUN 8 7 7   CREATININE 1.03* 0.85 0.92  CALCIUM 9.1 8.0* 8.2*   GFR: Estimated Creatinine Clearance: 75.8 mL/min (by C-G formula based on Cr of 0.92). Liver Function Tests:  Recent Labs Lab 03/03/16 1230 03/04/16 0513 03/04/16 0800  AST 44* 54* 55*  ALT 35 45 46  ALKPHOS 38 44 43  BILITOT 0.3 0.5 0.5  PROT 7.6 5.5* 5.5*  ALBUMIN 4.4 3.2* 3.3*    Recent Labs Lab 03/03/16 1230  LIPASE 43   No results for input(s): AMMONIA in the last 168 hours. Coagulation Profile:  Recent Labs Lab 03/03/16 1913 03/04/16 0800  INR 1.20 1.35   Cardiac Enzymes: No results for input(s): CKTOTAL, CKMB, CKMBINDEX, TROPONINI in the last 168 hours. BNP (last 3 results) No results for input(s): PROBNP in the last 8760 hours. HbA1C: No results for input(s): HGBA1C in the last 72 hours. CBG:  Recent Labs Lab 03/04/16 0722  GLUCAP 107*   Lipid Profile: No results for input(s): CHOL, HDL, LDLCALC, TRIG, CHOLHDL, LDLDIRECT in the last 72 hours. Thyroid Function Tests: No results for input(s): TSH, T4TOTAL, FREET4, T3FREE, THYROIDAB in the last 72 hours. Anemia Panel:  Recent Labs  03/04/16 0800  VITAMINB12 546   Urine analysis:    Component Value Date/Time   COLORURINE YELLOW 03/03/2016 [place] 03/03/2016 1205   LABSPEC 1.011 03/03/2016 1205   PHURINE 6.0 03/03/2016 1205   GLUCOSEU NEGATIVE 03/03/2016 1205   HGBUR MODERATE* 03/03/2016 1205   BILIRUBINUR NEGATIVE 03/03/2016 1205   KETONESUR NEGATIVE 03/03/2016  1205   PROTEINUR NEGATIVE 03/03/2016 1205   NITRITE NEGATIVE 03/03/2016 1205   LEUKOCYTESUR NEGATIVE 03/03/2016 1205   Sepsis Labs:  Recent Labs Lab 03/03/16 1913 03/03/16 2150 03/04/16 0800 03/04/16 1022  PROCALCITON 0.16  --  0.18  --   LATICACIDVEN 0.8 2.3* 0.8 0.9    Recent Results (from the past 240 hour(s))  Culture, blood (Routine X 2) w Reflex to ID Panel     Status: None (Preliminary result)   Collection Time: 03/03/16 12:20 PM  Result Value Ref Range Status   Specimen Description BLOOD RIGHT ANTECUBITAL  Final   Special Requests BOTTLES DRAWN AEROBIC AND ANAEROBIC  5CC  Final   Culture   Final    NO GROWTH < 24 HOURS Performed at [facility]    Report Status PENDING  Incomplete  Culture, blood (Routine X 2) w Reflex to ID Panel  Status: None (Preliminary result)   Collection Time: 03/03/16  2:05 PM  Result Value Ref Range Status   Specimen Description BLOOD RIGHT HAND  Final   Special Requests BOTTLES DRAWN AEROBIC AND ANAEROBIC  5CC  Final   Culture   Final    NO GROWTH < 24 HOURS Performed at [facility]    Report Status PENDING  Incomplete  MRSA PCR Screening     Status: None   Collection Time: 03/04/16 11:29 AM  Result Value Ref Range Status   MRSA by PCR NEGATIVE NEGATIVE Final    Comment:        The GeneXpert MRSA Assay (FDA approved for NASAL specimens only), is one component of a comprehensive MRSA colonization surveillance program. It is not intended to diagnose MRSA infection nor to guide or monitor treatment for MRSA infections.          Radiology Studies: Dg Chest Port 1 View  03/04/2016  CLINICAL DATA:  Sepsis. EXAM: PORTABLE CHEST 1 VIEW COMPARISON:  None. FINDINGS: The heart size and mediastinal contours are within normal limits. There is no evidence of pulmonary edema, consolidation, pneumothorax, nodule or pleural fluid. The visualized skeletal structures are unremarkable. IMPRESSION: No active disease.  Electronically Signed   By: Aletta Edouard M.D.   On: 03/04/2016 08:34   Ct Renal Stone Study  03/03/2016  CLINICAL DATA:  Right flank pain for 1 month EXAM: CT ABDOMEN AND PELVIS WITHOUT CONTRAST TECHNIQUE: Multidetector CT imaging of the abdomen and pelvis was performed following the standard protocol without IV contrast. COMPARISON:  Pelvic ultrasound 12/28/2014 FINDINGS: Lower chest:  The lung bases are unremarkable. Hepatobiliary: Unenhanced liver shows no biliary ductal dilatation. No calcified gallstones are noted within gallbladder. No CBD dilatation. Pancreas: Unenhanced pancreas is unremarkable. Spleen: Unenhanced spleen is unremarkable. Adrenals/Urinary Tract: No adrenal gland mass. Unenhanced kidneys are symmetrical in size. Nephrolithiasis. No hydronephrosis or hydroureter. No calcified ureteral calculi are noted. No calcified calculi are noted within under distended urinary bladder. Stomach/Bowel: There is no gastric outlet obstruction. The study is limited without IV or oral contrast. No small bowel obstruction. Abundant stool is noted in right colon and transverse colon. Abundant stool is noted within cecum. There is a low lying cecum. Normal appendix clearly visualized in coronal image 36. Moderate stool noted within and descending colon. Moderate stool noted within distal sigmoid colon. Moderate stool and gas noted within rectum. Vascular/Lymphatic: There is no aortic aneurysm. Pelvic phleboliths are noted. No retroperitoneal or mesenteric adenopathy. Reproductive: There is again noted retroverted uterus. There is anterior pedunculated uterine fibroid measures about 2.2 cm. No adnexal masses noted. No pelvic free fluid. Other: No ascites or free air. Musculoskeletal: No destructive bony lesions are noted. Sagittal images of the spine shows significant disc space flattening with vacuum disc phenomenon at L4-L5 level. Mild disc space flattening minimal posterior disc bulge at L3-L4 level. Mild  posterior disc bulge at L5-S1 level. No destructive bony lesions are noted within pelvis. IMPRESSION: 1. There is no evidence of nephrolithiasis. No hydronephrosis or hydroureter. No calcified ureteral calculi. 2. Abundant stool noted in right colon and redundant transverse colon. There is a low lying cecum. Abundant stool noted within cecum. No pericecal inflammation. Normal appendix. 3. Retroverted uterus. Anterior uterine fibroid measures at least 2.2 cm. No adnexal mass. No pelvic free fluid. 4. Degenerative changes lumbar spine. 5. No small bowel obstruction. Electronically Signed   By: Lahoma Crocker M.D.   On: 03/03/2016 13:33  Scheduled Meds: . doxycycline (VIBRAMYCIN) IV  100 mg Intravenous Q12H  . ferrous sulfate  325 mg Oral Q breakfast  . imipenem-cilastatin  500 mg Intravenous Q6H  . polyethylene glycol  17 g Oral BID   Continuous Infusions: . sodium chloride 100 mL/hr at 03/05/16 0600     LOS: 1 day    Time spent: 35 minutes.     Elmarie Shiley, MD [facility] Pager [phone]  If 7PM-7AM, please contact night-coverage [email or web address] Password [email or web address] 03/05/2016, 8:06 AM

## 2016-03-05 NOTE — Progress Notes (Signed)
Subjective:  felt worse from am till when I called her this aternoon and she now is having menstrual bleeding   Antibiotics:  Anti-infectives    Start     Dose/Rate Route Frequency Ordered Stop   03/05/16 2200  doxycycline (VIBRA-TABS) tablet 100 mg     100 mg Oral Every 12 hours 03/05/16 1121     03/04/16 1200  imipenem-cilastatin (PRIMAXIN) 500 mg in sodium chloride 0.9 % 100 mL IVPB     500 mg 200 mL/hr over 30 Minutes Intravenous Every 6 hours 03/04/16 0822     03/04/16 1000  doxycycline (VIBRAMYCIN) 100 mg in dextrose 5 % 250 mL IVPB  Status:  Discontinued     100 mg 125 mL/hr over 120 Minutes Intravenous Every 12 hours 03/04/16 0811 03/05/16 1120   03/04/16 1000  vancomycin (VANCOCIN) IVPB 750 mg/150 ml premix  Status:  Discontinued     750 mg 150 mL/hr over 60 Minutes Intravenous Every 8 hours 03/04/16 0822 03/04/16 0825   03/04/16 0800  piperacillin-tazobactam (ZOSYN) IVPB 3.375 g  Status:  Discontinued     3.375 g 100 mL/hr over 30 Minutes Intravenous  Once 03/04/16 0727 03/04/16 0809   03/04/16 0730  vancomycin (VANCOCIN) IVPB 1000 mg/200 mL premix  Status:  Discontinued     1,000 mg 200 mL/hr over 60 Minutes Intravenous  Once 03/04/16 0727 03/04/16 0822   03/03/16 2200  levofloxacin (LEVAQUIN) IVPB 750 mg  Status:  Discontinued     750 mg 100 mL/hr over 90 Minutes Intravenous Every 24 hours 03/03/16 1903 03/04/16 0809   03/03/16 1330  imipenem-cilastatin (PRIMAXIN) 500 mg in sodium chloride 0.9 % 100 mL IVPB     500 mg 200 mL/hr over 30 Minutes Intravenous  Once 03/03/16 1320 03/03/16 1442      Medications: Scheduled Meds: . doxycycline  100 mg Oral Q12H  . feeding supplement (ENSURE ENLIVE)  237 mL Oral TID BM  . ferrous sulfate  325 mg Oral Q breakfast  . imipenem-cilastatin  500 mg Intravenous Q6H  . polyethylene glycol  17 g Oral BID  . Tbo-filgastrim (GRANIX) SQ  480 mcg Subcutaneous q1800   Continuous Infusions: . sodium chloride 100 mL/hr at  03/05/16 1500   PRN Meds:.acetaminophen **OR** acetaminophen, bisacodyl, ondansetron **OR** ondansetron (ZOFRAN) IV, oxyCODONE, traMADol    Objective: Weight change:   Intake/Output Summary (Last 24 hours) at 03/05/16 1609 Last data filed at 03/05/16 1528  Gross per 24 hour  Intake   4430 ml  Output   2050 ml  Net   2380 ml   Blood pressure 124/58, pulse 72, temperature 97.8 F (36.6 C), temperature source Oral, resp. rate 13, height 5' 7.5" (1.715 m), weight 158 lb 4.6 oz (71.8 kg), last menstrual period 02/09/2016, SpO2 99 %. Temp:  [97.8 F (36.6 C)-100 F (37.8 C)] 97.8 F (36.6 C) (05/11 1100) Resp:  [10-22] 13 (05/11 1500) BP: (88-124)/(40-64) 124/58 mmHg (05/11 1400) SpO2:  [94 %-100 %] 99 % (05/11 1500)  Physical Exam: General: Alert and awake, oriented x3, not in any acute distress. HEENT: anicteric sclera, EOMI, oropharynx clear and without exudate Cardiovascular: regular rate, normal r, no murmur rubs or gallops Pulmonary: clear to auscultation bilaterally, no wheezing, rales or rhonchi Gastrointestinal: soft TTP in suprapubic area, nondistended, normal bowel sounds, Musculoskeletal: no clubbing or edema noted bilaterally Skin, soft tissue: no rashes Neuro: nonfocal, strength and sensation intact CBC:  CBC Latest Ref Rng 03/05/2016 03/04/2016  03/04/2016  WBC 4.0 - 10.5 K/uL 0.6(LL) 0.8(LL) 0.9(LL)  Hemoglobin 12.0 - 15.0 g/dL 10.8(L) 11.9(L) 11.5(L)  Hematocrit 36.0 - 46.0 % 31.8(L) 35.7(L) 33.9(L)  Platelets 150 - 400 K/uL 85(L) 112(L) 120(L)       BMET  Recent Labs  03/04/16 0800 03/05/16 0309  NA 135 137  K 4.1 3.5  CL 107 109  CO2 21* 21*  GLUCOSE 101* 114*  BUN 7 <5*  CREATININE 0.92 0.72  CALCIUM 8.2* 8.0*     Liver Panel   Recent Labs  03/04/16 0800 03/05/16 0309  PROT 5.5* 5.1*  ALBUMIN 3.3* 3.1*  AST 55* 60*  ALT 46 50  ALKPHOS 43 52  BILITOT 0.5 0.4       Sedimentation Rate No results for input(s): ESRSEDRATE in  the last 72 hours. C-Reactive Protein No results for input(s): CRP in the last 72 hours.  Micro Results: Recent Results (from the past 720 hour(s))  Urine culture     Status: Abnormal   Collection Time: 03/03/16 12:05 PM  Result Value Ref Range Status   Specimen Description URINE, CLEAN CATCH  Final   Special Requests NONE  Final   Culture (A)  Final    2,000 COLONIES/mL INSIGNIFICANT GROWTH Performed at [facility]    Report Status 03/05/2016 FINAL  Final  Culture, blood (Routine X 2) w Reflex to ID Panel     Status: None (Preliminary result)   Collection Time: 03/03/16 12:20 PM  Result Value Ref Range Status   Specimen Description BLOOD RIGHT ANTECUBITAL  Final   Special Requests BOTTLES DRAWN AEROBIC AND ANAEROBIC  5CC  Final   Culture   Final    NO GROWTH 2 DAYS Performed at [facility]    Report Status PENDING  Incomplete  Culture, blood (Routine X 2) w Reflex to ID Panel     Status: None (Preliminary result)   Collection Time: 03/03/16  2:05 PM  Result Value Ref Range Status   Specimen Description BLOOD RIGHT HAND  Final   Special Requests BOTTLES DRAWN AEROBIC AND ANAEROBIC  5CC  Final   Culture   Final    NO GROWTH 2 DAYS Performed at [facility]    Report Status PENDING  Incomplete  MRSA PCR Screening     Status: None   Collection Time: 03/04/16 11:29 AM  Result Value Ref Range Status   MRSA by PCR NEGATIVE NEGATIVE Final    Comment:        The GeneXpert MRSA Assay (FDA approved for NASAL specimens only), is one component of a comprehensive MRSA colonization surveillance program. It is not intended to diagnose MRSA infection nor to guide or monitor treatment for MRSA infections.   Culture, blood (x 2)     Status: None (Preliminary result)   Collection Time: 03/05/16 12:55 PM  Result Value Ref Range Status   Specimen Description BLOOD BLOOD LEFT HAND  Final   Special Requests BOTTLES DRAWN AEROBIC ONLY 1.5CC  Final   Culture  PENDING  Incomplete   Report Status PENDING  Incomplete    Studies/Results: Ct Chest W Contrast  03/05/2016  CLINICAL DATA:  Febrile neutropenia. EXAM: CT CHEST, ABDOMEN, AND PELVIS WITH CONTRAST TECHNIQUE: Multidetector CT imaging of the chest, abdomen and pelvis was performed following the standard protocol during bolus administration of intravenous contrast. CONTRAST:  152mL ISOVUE-300 IOPAMIDOL (ISOVUE-300) INJECTION 61% COMPARISON:  None. FINDINGS: CT CHEST THORACIC INLET/BODY WALL: No acute abnormality. MEDIASTINUM: Normal heart  size. No pericardial effusion. No acute vascular abnormality. Incidental aberrant right subclavian artery. No adenopathy. LUNG WINDOWS: No consolidation.  No effusion.  No suspicious pulmonary nodule. UPPER ABDOMEN: No acute findings. OSSEOUS: No acute fracture. No suspicious lytic or blastic lesions. Right rotator cuff repair. CT ABDOMEN AND PELVIS Aabdominal wall: Small fatty umbilical hernia. Abdominal wall subcutaneous gas from medication injection Hepatobiliary: No focal liver abnormality.No evidence of biliary obstruction or stone. Pancreas: Unremarkable. Spleen: Unremarkable. Adrenals/Urinary Tract: Negative adrenals. No hydronephrosis or stone. Unremarkable bladder. Reproductive:Globular appearance of the uterus with indistinct endometrium and probable tiny cyst. Dimensions are similar to pelvic ultrasound 12/28/2014 when the endometrium had a normal appearance. There is a sub serosal an exophytic fibroid from the lower uterine segment measuring 21 mm. Stomach/Bowel:  No obstruction. No appendicitis. Vascular/Lymphatic: No acute vascular abnormality. No mass or adenopathy. Peritoneal: No ascites or pneumoperitoneum. Musculoskeletal: No contributory finding. IMPRESSION: 1. No explanation for fever. No signs of infection and no adenopathy. 2. Globular enlargement of the uterus similar to 12/28/2014 pelvic ultrasound and suggesting adenomyosis. Exophytic uterine fibroid.  Electronically Signed   By: Monte Fantasia M.D.   On: 03/05/2016 15:38   Ct Abdomen Pelvis W Contrast  03/05/2016  CLINICAL DATA:  Febrile neutropenia. EXAM: CT CHEST, ABDOMEN, AND PELVIS WITH CONTRAST TECHNIQUE: Multidetector CT imaging of the chest, abdomen and pelvis was performed following the standard protocol during bolus administration of intravenous contrast. CONTRAST:  148mL ISOVUE-300 IOPAMIDOL (ISOVUE-300) INJECTION 61% COMPARISON:  None. FINDINGS: CT CHEST THORACIC INLET/BODY WALL: No acute abnormality. MEDIASTINUM: Normal heart size. No pericardial effusion. No acute vascular abnormality. Incidental aberrant right subclavian artery. No adenopathy. LUNG WINDOWS: No consolidation.  No effusion.  No suspicious pulmonary nodule. UPPER ABDOMEN: No acute findings. OSSEOUS: No acute fracture. No suspicious lytic or blastic lesions. Right rotator cuff repair. CT ABDOMEN AND PELVIS Aabdominal wall: Small fatty umbilical hernia. Abdominal wall subcutaneous gas from medication injection Hepatobiliary: No focal liver abnormality.No evidence of biliary obstruction or stone. Pancreas: Unremarkable. Spleen: Unremarkable. Adrenals/Urinary Tract: Negative adrenals. No hydronephrosis or stone. Unremarkable bladder. Reproductive:Globular appearance of the uterus with indistinct endometrium and probable tiny cyst. Dimensions are similar to pelvic ultrasound 12/28/2014 when the endometrium had a normal appearance. There is a sub serosal an exophytic fibroid from the lower uterine segment measuring 21 mm. Stomach/Bowel:  No obstruction. No appendicitis. Vascular/Lymphatic: No acute vascular abnormality. No mass or adenopathy. Peritoneal: No ascites or pneumoperitoneum. Musculoskeletal: No contributory finding. IMPRESSION: 1. No explanation for fever. No signs of infection and no adenopathy. 2. Globular enlargement of the uterus similar to 12/28/2014 pelvic ultrasound and suggesting adenomyosis. Exophytic uterine fibroid.  Electronically Signed   By: Monte Fantasia M.D.   On: 03/05/2016 15:38   Dg Chest Port 1 View  03/04/2016  CLINICAL DATA:  Sepsis. EXAM: PORTABLE CHEST 1 VIEW COMPARISON:  None. FINDINGS: The heart size and mediastinal contours are within normal limits. There is no evidence of pulmonary edema, consolidation, pneumothorax, nodule or pleural fluid. The visualized skeletal structures are unremarkable. IMPRESSION: No active disease. Electronically Signed   By: Aletta Edouard M.D.   On: 03/04/2016 08:34      Assessment/Plan:  INTERVAL HISTORY:  Worsening leukopenia and TTPEnia and now menstrual bleeding   Principal Problem:   Neutropenic fever (HCC) Active Problems:   Pyelonephritis   Leukopenia   Hyponatremia   Anemia, iron deficiency   Sepsis (HCC)   Drug-induced neutropenia (HCC)   LFTs abnormal   Ehrlichiosis   FUO (fever of  unknown origin)    Kim Gonzales is a 45 y.o. female with  with admission with febrile neutropenia and SIRS, sepsis picture   #1 Febrile neutropenia:  Still leading causes considered are bactrim induced marrow suppression, Ehrlichiiosis.   She has had progressive worsening despite stopping the bactrim since Tuesday and despite 24 hours of doxycyline  I therefore ordered a CT abdomen and pelvis and CT chest to look for another source but these studies were negative for infection, malignancy  I felt it was OK to give Granix today given lack of response to therapy of abx and holding bactrim  Patient told me that her WBC may run low. Perhaps whatever is causing her myelosuppression is doing so from a lower baseline with re to her WBC. Her OB Gyn and PCP were to fax labs to the floor. I am trying to contact RN to review over the phone  Change doxy to oral given  Burning with IV infusion  Continue imipenem  followup cultures serologies  I spent greater than 35 minutes with the patient including greater than 50% of time in face to face counsel of the  patient her husband re febrile neutropenia, TTPenia, workup for her fevers and cytopenias  and in coordination of her  care.  I will call her brother again and give him an update.   LOS: 1 day   Alcide Evener 03/05/2016, 4:09 PM

## 2016-03-05 NOTE — Progress Notes (Signed)
Kim Gonzales   DOB:August 12, 1971   ZM:[id number]    Subjective: The patient was transferred to ICU yesterday due to clinical signs of sepsis with hypotension. She felt better today. She was started on doxycycline by infectious disease. The patient denies any recent signs or symptoms of bleeding such as spontaneous epistaxis, hematuria or hematochezia. She has been afebrile since midnight  Objective:  Filed Vitals:   03/05/16 0700 03/05/16 0804  BP:  109/58  Pulse:    Temp:    Resp: 15 18     Intake/Output Summary (Last 24 hours) at 03/05/16 [id number] Last data filed at 03/05/16 0800  Gross per 24 hour  Intake   3480 ml  Output   1200 ml  Net   2280 ml    GENERAL:alert, no distress and comfortable SKIN: skin color, texture, turgor are normal, no rashes or significant lesions EYES: normal, Conjunctiva are pink and non-injected, sclera clear Musculoskeletal:no cyanosis of digits and no clubbing  NEURO: alert & oriented x 3 with fluent speech, no focal motor/sensory deficits   Labs:  Lab Results  Component Value Date   WBC 0.6* 03/05/2016   HGB 10.8* 03/05/2016   HCT 31.8* 03/05/2016   MCV 78.9 03/05/2016   PLT 85* 03/05/2016   NEUTROABS 0.3* 03/05/2016    Lab Results  Component Value Date   NA 135 03/04/2016   K 4.1 03/04/2016   CL 107 03/04/2016   CO2 21* 03/04/2016    Studies:  Dg Chest Port 1 View  03/04/2016  CLINICAL DATA:  Sepsis. EXAM: PORTABLE CHEST 1 VIEW COMPARISON:  None. FINDINGS: The heart size and mediastinal contours are within normal limits. There is no evidence of pulmonary edema, consolidation, pneumothorax, nodule or pleural fluid. The visualized skeletal structures are unremarkable. IMPRESSION: No active disease. Electronically Signed   By: Aletta Edouard M.D.   On: 03/04/2016 08:34   Ct Renal Stone Study  03/03/2016  CLINICAL DATA:  Right flank pain for 1 month EXAM: CT ABDOMEN AND PELVIS WITHOUT CONTRAST TECHNIQUE: Multidetector CT imaging of the  abdomen and pelvis was performed following the standard protocol without IV contrast. COMPARISON:  Pelvic ultrasound 12/28/2014 FINDINGS: Lower chest:  The lung bases are unremarkable. Hepatobiliary: Unenhanced liver shows no biliary ductal dilatation. No calcified gallstones are noted within gallbladder. No CBD dilatation. Pancreas: Unenhanced pancreas is unremarkable. Spleen: Unenhanced spleen is unremarkable. Adrenals/Urinary Tract: No adrenal gland mass. Unenhanced kidneys are symmetrical in size. Nephrolithiasis. No hydronephrosis or hydroureter. No calcified ureteral calculi are noted. No calcified calculi are noted within under distended urinary bladder. Stomach/Bowel: There is no gastric outlet obstruction. The study is limited without IV or oral contrast. No small bowel obstruction. Abundant stool is noted in right colon and transverse colon. Abundant stool is noted within cecum. There is a low lying cecum. Normal appendix clearly visualized in coronal image 36. Moderate stool noted within and descending colon. Moderate stool noted within distal sigmoid colon. Moderate stool and gas noted within rectum. Vascular/Lymphatic: There is no aortic aneurysm. Pelvic phleboliths are noted. No retroperitoneal or mesenteric adenopathy. Reproductive: There is again noted retroverted uterus. There is anterior pedunculated uterine fibroid measures about 2.2 cm. No adnexal masses noted. No pelvic free fluid. Other: No ascites or free air. Musculoskeletal: No destructive bony lesions are noted. Sagittal images of the spine shows significant disc space flattening with vacuum disc phenomenon at L4-L5 level. Mild disc space flattening minimal posterior disc bulge at L3-L4 level. Mild posterior disc bulge at L5-S1 level.  No destructive bony lesions are noted within pelvis. IMPRESSION: 1. There is no evidence of nephrolithiasis. No hydronephrosis or hydroureter. No calcified ureteral calculi. 2. Abundant stool noted in right  colon and redundant transverse colon. There is a low lying cecum. Abundant stool noted within cecum. No pericecal inflammation. Normal appendix. 3. Retroverted uterus. Anterior uterine fibroid measures at least 2.2 cm. No adnexal mass. No pelvic free fluid. 4. Degenerative changes lumbar spine. 5. No small bowel obstruction. Electronically Signed   By: Lahoma Crocker M.D.   On: 03/03/2016 13:33    Assessment & Plan:   Pancytopenia, likely related to sepsis The most likely cause of pancytopenia is due to bone marrow suppression from recent urinary tract infection/sepsis and side effects from Bactrim Serum vitamin B-12 is normal She does not require transfusion. Monitor carefully.  Neutropenic fever, likely UTI source This is likely due to bone marrow suppression. I recommend G-CSF daily until [place] greater than 1.5  Mild coagulopathy, DIC versus related to vitamin K deficiency from recurrent antibiotic therapy She had received recurrent antibiotic therapy, resolved Fibrinogen is normal She has no clinical signs of bleeding. Observe only for now.  Headaches, likely related to hypotension She is receiving multiple bolus IV fluids Continue conservative management with Tylenol as needed for headaches  Discharge planning Not ready for discharge due to neutropenia Will follow tomorrow  [facility], [place], MD 03/05/2016  9:04 AM

## 2016-03-06 DIAGNOSIS — D696 Thrombocytopenia, unspecified: Secondary | ICD-10-CM

## 2016-03-06 LAB — CBC WITH DIFFERENTIAL/PLATELET
BASOS ABS: 0 10*3/uL (ref 0.0–0.1)
BASOS PCT: 0 %
Band Neutrophils: 70 %
Blasts: 0 %
Eosinophils Absolute: 0 10*3/uL (ref 0.0–0.7)
Eosinophils Relative: 0 %
HCT: 32.8 % — ABNORMAL LOW (ref 36.0–46.0)
HEMOGLOBIN: 11.3 g/dL — AB (ref 12.0–15.0)
Lymphocytes Relative: 11 %
Lymphs Abs: 0.4 10*3/uL — ABNORMAL LOW (ref 0.7–4.0)
MCH: 27.1 pg (ref 26.0–34.0)
MCHC: 34.5 g/dL (ref 30.0–36.0)
MCV: 78.7 fL (ref 78.0–100.0)
MONO ABS: 0.1 10*3/uL (ref 0.1–1.0)
MYELOCYTES: 0 %
Metamyelocytes Relative: 0 %
Monocytes Relative: 2 %
NEUTROS PCT: 17 %
NRBC: 0 /100{WBCs}
Neutro Abs: 3.3 10*3/uL (ref 1.7–7.7)
Other: 0 %
PROMYELOCYTES ABS: 0 %
Platelets: 75 10*3/uL — ABNORMAL LOW (ref 150–400)
RBC: 4.17 MIL/uL (ref 3.87–5.11)
RDW: 14.7 % (ref 11.5–15.5)
WBC: 3.8 10*3/uL — AB (ref 4.0–10.5)

## 2016-03-06 LAB — COMPREHENSIVE METABOLIC PANEL
ALK PHOS: 92 U/L (ref 38–126)
ALT: 91 U/L — ABNORMAL HIGH (ref 14–54)
ANION GAP: 9 (ref 5–15)
AST: 127 U/L — ABNORMAL HIGH (ref 15–41)
Albumin: 3.1 g/dL — ABNORMAL LOW (ref 3.5–5.0)
BILIRUBIN TOTAL: 0.6 mg/dL (ref 0.3–1.2)
BUN: 6 mg/dL (ref 6–20)
CALCIUM: 8.4 mg/dL — AB (ref 8.9–10.3)
CO2: 23 mmol/L (ref 22–32)
Chloride: 108 mmol/L (ref 101–111)
Creatinine, Ser: 0.77 mg/dL (ref 0.44–1.00)
GLUCOSE: 102 mg/dL — AB (ref 65–99)
Potassium: 3.7 mmol/L (ref 3.5–5.1)
SODIUM: 140 mmol/L (ref 135–145)
TOTAL PROTEIN: 5 g/dL — AB (ref 6.5–8.1)

## 2016-03-06 LAB — ROCKY MTN SPOTTED FVR ABS PNL(IGG+IGM)
RMSF IgG: NEGATIVE
RMSF IgM: 0.17 index (ref 0.00–0.89)

## 2016-03-06 LAB — GLUCOSE, CAPILLARY: GLUCOSE-CAPILLARY: 116 mg/dL — AB (ref 65–99)

## 2016-03-06 MED ORDER — FERROUS SULFATE 325 (65 FE) MG PO TABS: 325.0000 mg | ORAL_TABLET | Freq: Every day | ORAL | Status: DC

## 2016-03-06 NOTE — Progress Notes (Signed)
Patient transferred from ICU. Patient is alert and oriented x 4. Pleasant.  Placed comfortably in bed. C/o headache, pain level of 3/10, patient received Tylenol in ICU. Will continue to monitor.

## 2016-03-06 NOTE — Progress Notes (Signed)
Kim Gonzales   DOB:03/02/1971   ZM:[id number]    Subjective: She feels well. Remain afebrile with normal vitals The patient denies any recent signs or symptoms of bleeding such as spontaneous epistaxis, hematuria or hematochezia.   Objective:  Filed Vitals:   03/06/16 0500 03/06/16 0600  BP: 115/63 121/71  Pulse:    Temp:    Resp: 16 18     Intake/Output Summary (Last 24 hours) at 03/06/16 0834 Last data filed at 03/06/16 [id number]  Gross per 24 hour  Intake   2850 ml  Output   3000 ml  Net   -150 ml    GENERAL:alert, no distress and comfortable SKIN: skin color, texture, turgor are normal, no rashes or significant lesions Musculoskeletal:no cyanosis of digits and no clubbing  NEURO: alert & oriented x 3 with fluent speech, no focal motor/sensory deficits   Labs:  Lab Results  Component Value Date   WBC 3.8* 03/06/2016   HGB 11.3* 03/06/2016   HCT 32.8* 03/06/2016   MCV 78.7 03/06/2016   PLT 75* 03/06/2016   NEUTROABS 3.3 03/06/2016    Lab Results  Component Value Date   NA 140 03/06/2016   K 3.7 03/06/2016   CL 108 03/06/2016   CO2 23 03/06/2016    Studies:  Ct Chest W Contrast  03/05/2016  CLINICAL DATA:  Febrile neutropenia. EXAM: CT CHEST, ABDOMEN, AND PELVIS WITH CONTRAST TECHNIQUE: Multidetector CT imaging of the chest, abdomen and pelvis was performed following the standard protocol during bolus administration of intravenous contrast. CONTRAST:  151mL ISOVUE-300 IOPAMIDOL (ISOVUE-300) INJECTION 61% COMPARISON:  None. FINDINGS: CT CHEST THORACIC INLET/BODY WALL: No acute abnormality. MEDIASTINUM: Normal heart size. No pericardial effusion. No acute vascular abnormality. Incidental aberrant right subclavian artery. No adenopathy. LUNG WINDOWS: No consolidation.  No effusion.  No suspicious pulmonary nodule. UPPER ABDOMEN: No acute findings. OSSEOUS: No acute fracture. No suspicious lytic or blastic lesions. Right rotator cuff repair. CT ABDOMEN AND PELVIS Aabdominal  wall: Small fatty umbilical hernia. Abdominal wall subcutaneous gas from medication injection Hepatobiliary: No focal liver abnormality.No evidence of biliary obstruction or stone. Pancreas: Unremarkable. Spleen: Unremarkable. Adrenals/Urinary Tract: Negative adrenals. No hydronephrosis or stone. Unremarkable bladder. Reproductive:Globular appearance of the uterus with indistinct endometrium and probable tiny cyst. Dimensions are similar to pelvic ultrasound 12/28/2014 when the endometrium had a normal appearance. There is a sub serosal an exophytic fibroid from the lower uterine segment measuring 21 mm. Stomach/Bowel:  No obstruction. No appendicitis. Vascular/Lymphatic: No acute vascular abnormality. No mass or adenopathy. Peritoneal: No ascites or pneumoperitoneum. Musculoskeletal: No contributory finding. IMPRESSION: 1. No explanation for fever. No signs of infection and no adenopathy. 2. Globular enlargement of the uterus similar to 12/28/2014 pelvic ultrasound and suggesting adenomyosis. Exophytic uterine fibroid. Electronically Signed   By: Monte Fantasia M.D.   On: 03/05/2016 15:38   Ct Abdomen Pelvis W Contrast  03/05/2016  CLINICAL DATA:  Febrile neutropenia. EXAM: CT CHEST, ABDOMEN, AND PELVIS WITH CONTRAST TECHNIQUE: Multidetector CT imaging of the chest, abdomen and pelvis was performed following the standard protocol during bolus administration of intravenous contrast. CONTRAST:  164mL ISOVUE-300 IOPAMIDOL (ISOVUE-300) INJECTION 61% COMPARISON:  None. FINDINGS: CT CHEST THORACIC INLET/BODY WALL: No acute abnormality. MEDIASTINUM: Normal heart size. No pericardial effusion. No acute vascular abnormality. Incidental aberrant right subclavian artery. No adenopathy. LUNG WINDOWS: No consolidation.  No effusion.  No suspicious pulmonary nodule. UPPER ABDOMEN: No acute findings. OSSEOUS: No acute fracture. No suspicious lytic or blastic lesions. Right rotator cuff repair. CT  ABDOMEN AND PELVIS Aabdominal  wall: Small fatty umbilical hernia. Abdominal wall subcutaneous gas from medication injection Hepatobiliary: No focal liver abnormality.No evidence of biliary obstruction or stone. Pancreas: Unremarkable. Spleen: Unremarkable. Adrenals/Urinary Tract: Negative adrenals. No hydronephrosis or stone. Unremarkable bladder. Reproductive:Globular appearance of the uterus with indistinct endometrium and probable tiny cyst. Dimensions are similar to pelvic ultrasound 12/28/2014 when the endometrium had a normal appearance. There is a sub serosal an exophytic fibroid from the lower uterine segment measuring 21 mm. Stomach/Bowel:  No obstruction. No appendicitis. Vascular/Lymphatic: No acute vascular abnormality. No mass or adenopathy. Peritoneal: No ascites or pneumoperitoneum. Musculoskeletal: No contributory finding. IMPRESSION: 1. No explanation for fever. No signs of infection and no adenopathy. 2. Globular enlargement of the uterus similar to 12/28/2014 pelvic ultrasound and suggesting adenomyosis. Exophytic uterine fibroid. Electronically Signed   By: Monte Fantasia M.D.   On: 03/05/2016 15:38    Assessment & Plan:   Pancytopenia, likely related to sepsis The most likely cause of pancytopenia is due to bone marrow suppression from recent urinary tract infection/sepsis and side effects from Bactrim Serum vitamin B-12 is normal She does not require transfusion. Monitor carefully. The persistent thrombocytopenia is related to broad-spectrum antibiotic therapy. Should improve in time.  Neutropenic fever, likely UTI source, resolved This is likely due to bone marrow suppression. I recommend G-CSF daily until [place] greater than 1.5 She had received 2 doses. ANC today is greater than 1.5. I will stop. Continue antibiotics as directed  Mild coagulopathy, DIC versus related to vitamin K deficiency from recurrent antibiotic therapy She had received recurrent antibiotic therapy, resolved Fibrinogen is normal She  has no clinical signs of bleeding. Observe only for now.  Headaches, likely related to hypotension, resolved She is receiving multiple bolus IV fluids Continue conservative management with Tylenol as needed for headaches  Discharge planning She is ready to be discharged from the hematology standpoint. I recommend follow-up with PCP within 7-10 days for repeat CBC. If her blood counts remained low at that time, her PCP can send a referral for me to follow in the outpatient setting In most cases, greater than 90% of patients will have a normal CBC within 30 days of discharge and there is a reason why I do not think she needs to follow with me in the future. Will sign off. Call if questions arise  [facility], Donye Dauenhauer, MD 03/06/2016  8:34 AM

## 2016-03-06 NOTE — Care Management Note (Signed)
Case Management Note  Patient Details  Name: Kim Gonzales MRN: [id number] Date of Birth: 1971-08-12  Subjective/Objective:       Infectious process in patient with wbc 0.3             Action/Plan:Date:  Mar 06, 2016 Chart reviewed for concurrent status and case management needs. Will continue to follow patient for changes and needs: Expected discharge date: [id number] Velva Harman, BSN, [place], [place]   Expected Discharge Date:   (unknown)  [id number]             Expected Discharge Plan:  Home/Self Care  In-House Referral:  NA  Discharge planning Services  CM Consult  Post Acute Care Choice:  NA Choice offered to:  NA  DME Arranged:  N/A DME Agency:  NA  HH Arranged:  NA HH Agency:  NA  Status of Service:  In process, will continue to follow  Medicare Important Message Given:    Date Medicare IM Given:    Medicare IM give by:    Date Additional Medicare IM Given:    Additional Medicare Important Message give by:     If discussed at [place] of Stay Meetings, dates discussed:    Additional Comments:  Leeroy Cha, RN 03/06/2016, 10:01 AM

## 2016-03-06 NOTE — Progress Notes (Addendum)
Subjective:  Feels better  Antibiotics:  Anti-infectives    Start     Dose/Rate Route Frequency Ordered Stop   03/05/16 2200  doxycycline (VIBRA-TABS) tablet 100 mg     100 mg Oral Every 12 hours 03/05/16 1121     03/04/16 1200  imipenem-cilastatin (PRIMAXIN) 500 mg in sodium chloride 0.9 % 100 mL IVPB  Status:  Discontinued     500 mg 200 mL/hr over 30 Minutes Intravenous Every 6 hours 03/04/16 0822 03/06/16 0917   03/04/16 1000  doxycycline (VIBRAMYCIN) 100 mg in dextrose 5 % 250 mL IVPB  Status:  Discontinued     100 mg 125 mL/hr over 120 Minutes Intravenous Every 12 hours 03/04/16 0811 03/05/16 1120   03/04/16 1000  vancomycin (VANCOCIN) IVPB 750 mg/150 ml premix  Status:  Discontinued     750 mg 150 mL/hr over 60 Minutes Intravenous Every 8 hours 03/04/16 0822 03/04/16 0825   03/04/16 0800  piperacillin-tazobactam (ZOSYN) IVPB 3.375 g  Status:  Discontinued     3.375 g 100 mL/hr over 30 Minutes Intravenous  Once 03/04/16 0727 03/04/16 0809   03/04/16 0730  vancomycin (VANCOCIN) IVPB 1000 mg/200 mL premix  Status:  Discontinued     1,000 mg 200 mL/hr over 60 Minutes Intravenous  Once 03/04/16 0727 03/04/16 0822   03/03/16 2200  levofloxacin (LEVAQUIN) IVPB 750 mg  Status:  Discontinued     750 mg 100 mL/hr over 90 Minutes Intravenous Every 24 hours 03/03/16 1903 03/04/16 0809   03/03/16 1330  imipenem-cilastatin (PRIMAXIN) 500 mg in sodium chloride 0.9 % 100 mL IVPB     500 mg 200 mL/hr over 30 Minutes Intravenous  Once 03/03/16 1320 03/03/16 1442      Medications: Scheduled Meds: . doxycycline  100 mg Oral Q12H  . feeding supplement (ENSURE ENLIVE)  237 mL Oral TID BM  . [START ON 03/07/2016] ferrous sulfate  325 mg Oral Q lunch  . polyethylene glycol  17 g Oral BID   Continuous Infusions: . sodium chloride 100 mL/hr at 03/05/16 1500   PRN Meds:.acetaminophen **OR** acetaminophen, bisacodyl, ondansetron **OR** ondansetron (ZOFRAN) IV, oxyCODONE,  traMADol    Objective: Weight change:   Intake/Output Summary (Last 24 hours) at 03/06/16 1550 Last data filed at 03/06/16 1500  Gross per 24 hour  Intake   2700 ml  Output   2150 ml  Net    550 ml   Blood pressure 102/56, pulse 72, temperature 97.6 F (36.4 C), temperature source Oral, resp. rate 17, height 5' 7.5" (1.715 m), weight 158 lb 4.6 oz (71.8 kg), last menstrual period 02/09/2016, SpO2 100 %. Temp:  [97.6 F (36.4 C)-98.9 F (37.2 C)] 97.6 F (36.4 C) (05/12 1200) Resp:  [12-18] 17 (05/12 1418) BP: (99-121)/(48-76) 102/56 mmHg (05/12 1418) SpO2:  [100 %] 100 % (05/12 1418)  Physical Exam: General: Alert and awake, oriented x3, not in any acute distress. HEENT: anicteric sclera, EOMI, oropharynx clear and without exudate Cardiovascular: regular rate, normal r, no murmur rubs or gallops Pulmonary: clear to auscultation bilaterally, no wheezing, rales or rhonchi Gastrointestinal: soft TTP in suprapubic area, nondistended, normal bowel sounds, Musculoskeletal: no clubbing or edema noted bilaterally Skin, soft tissue: no rashes Neuro: nonfocal  CBC:  CBC Latest Ref Rng 03/06/2016 03/05/2016 03/04/2016  WBC 4.0 - 10.5 K/uL 3.8(L) 0.6(LL) 0.8(LL)  Hemoglobin 12.0 - 15.0 g/dL 11.3(L) 10.8(L) 11.9(L)  Hematocrit 36.0 - 46.0 % 32.8(L) 31.8(L) 35.7(L)  Platelets 150 -  400 K/uL 75(L) 85(L) 112(L)       BMET  Recent Labs  03/05/16 0309 03/06/16 0313  NA 137 140  K 3.5 3.7  CL 109 108  CO2 21* 23  GLUCOSE 114* 102*  BUN <5* 6  CREATININE 0.72 0.77  CALCIUM 8.0* 8.4*     Liver Panel   Recent Labs  03/05/16 0309 03/06/16 0313  PROT 5.1* 5.0*  ALBUMIN 3.1* 3.1*  AST 60* 127*  ALT 50 91*  ALKPHOS 52 92  BILITOT 0.4 0.6       Sedimentation Rate No results for input(s): ESRSEDRATE in the last 72 hours. C-Reactive Protein No results for input(s): CRP in the last 72 hours.  Micro Results: Recent Results (from the past 720 hour(s))  Urine  culture     Status: Abnormal   Collection Time: 03/03/16 12:05 PM  Result Value Ref Range Status   Specimen Description URINE, CLEAN CATCH  Final   Special Requests NONE  Final   Culture (A)  Final    2,000 COLONIES/mL INSIGNIFICANT GROWTH Performed at [facility]    Report Status 03/05/2016 FINAL  Final  Culture, blood (Routine X 2) w Reflex to ID Panel     Status: None (Preliminary result)   Collection Time: 03/03/16 12:20 PM  Result Value Ref Range Status   Specimen Description BLOOD RIGHT ANTECUBITAL  Final   Special Requests BOTTLES DRAWN AEROBIC AND ANAEROBIC  5CC  Final   Culture   Final    NO GROWTH 3 DAYS Performed at [facility]    Report Status PENDING  Incomplete  Culture, blood (Routine X 2) w Reflex to ID Panel     Status: None (Preliminary result)   Collection Time: 03/03/16  2:05 PM  Result Value Ref Range Status   Specimen Description BLOOD RIGHT HAND  Final   Special Requests BOTTLES DRAWN AEROBIC AND ANAEROBIC  5CC  Final   Culture   Final    NO GROWTH 3 DAYS Performed at [facility]    Report Status PENDING  Incomplete  MRSA PCR Screening     Status: None   Collection Time: 03/04/16 11:29 AM  Result Value Ref Range Status   MRSA by PCR NEGATIVE NEGATIVE Final    Comment:        The GeneXpert MRSA Assay (FDA approved for NASAL specimens only), is one component of a comprehensive MRSA colonization surveillance program. It is not intended to diagnose MRSA infection nor to guide or monitor treatment for MRSA infections.   Culture, blood (x 2)     Status: None (Preliminary result)   Collection Time: 03/05/16 12:07 PM  Result Value Ref Range Status   Specimen Description BLOOD BLOOD LEFT HAND  Final   Special Requests BOTTLES DRAWN AEROBIC ONLY [place]  Final   Culture   Final    NO GROWTH 1 DAY Performed at [facility]    Report Status PENDING  Incomplete  Culture, blood (x 2)     Status: None (Preliminary result)    Collection Time: 03/05/16 12:55 PM  Result Value Ref Range Status   Specimen Description BLOOD BLOOD LEFT HAND  Final   Special Requests BOTTLES DRAWN AEROBIC ONLY 1.5CC  Final   Culture   Final    NO GROWTH 1 DAY Performed at [facility]    Report Status PENDING  Incomplete    Studies/Results: Ct Chest W Contrast  03/05/2016  CLINICAL DATA:  Febrile  neutropenia. EXAM: CT CHEST, ABDOMEN, AND PELVIS WITH CONTRAST TECHNIQUE: Multidetector CT imaging of the chest, abdomen and pelvis was performed following the standard protocol during bolus administration of intravenous contrast. CONTRAST:  161mL ISOVUE-300 IOPAMIDOL (ISOVUE-300) INJECTION 61% COMPARISON:  None. FINDINGS: CT CHEST THORACIC INLET/BODY WALL: No acute abnormality. MEDIASTINUM: Normal heart size. No pericardial effusion. No acute vascular abnormality. Incidental aberrant right subclavian artery. No adenopathy. LUNG WINDOWS: No consolidation.  No effusion.  No suspicious pulmonary nodule. UPPER ABDOMEN: No acute findings. OSSEOUS: No acute fracture. No suspicious lytic or blastic lesions. Right rotator cuff repair. CT ABDOMEN AND PELVIS Aabdominal wall: Small fatty umbilical hernia. Abdominal wall subcutaneous gas from medication injection Hepatobiliary: No focal liver abnormality.No evidence of biliary obstruction or stone. Pancreas: Unremarkable. Spleen: Unremarkable. Adrenals/Urinary Tract: Negative adrenals. No hydronephrosis or stone. Unremarkable bladder. Reproductive:Globular appearance of the uterus with indistinct endometrium and probable tiny cyst. Dimensions are similar to pelvic ultrasound 12/28/2014 when the endometrium had a normal appearance. There is a sub serosal an exophytic fibroid from the lower uterine segment measuring 21 mm. Stomach/Bowel:  No obstruction. No appendicitis. Vascular/Lymphatic: No acute vascular abnormality. No mass or adenopathy. Peritoneal: No ascites or pneumoperitoneum. Musculoskeletal: No  contributory finding. IMPRESSION: 1. No explanation for fever. No signs of infection and no adenopathy. 2. Globular enlargement of the uterus similar to 12/28/2014 pelvic ultrasound and suggesting adenomyosis. Exophytic uterine fibroid. Electronically Signed   By: Monte Fantasia M.D.   On: 03/05/2016 15:38   Ct Abdomen Pelvis W Contrast  03/05/2016  CLINICAL DATA:  Febrile neutropenia. EXAM: CT CHEST, ABDOMEN, AND PELVIS WITH CONTRAST TECHNIQUE: Multidetector CT imaging of the chest, abdomen and pelvis was performed following the standard protocol during bolus administration of intravenous contrast. CONTRAST:  135mL ISOVUE-300 IOPAMIDOL (ISOVUE-300) INJECTION 61% COMPARISON:  None. FINDINGS: CT CHEST THORACIC INLET/BODY WALL: No acute abnormality. MEDIASTINUM: Normal heart size. No pericardial effusion. No acute vascular abnormality. Incidental aberrant right subclavian artery. No adenopathy. LUNG WINDOWS: No consolidation.  No effusion.  No suspicious pulmonary nodule. UPPER ABDOMEN: No acute findings. OSSEOUS: No acute fracture. No suspicious lytic or blastic lesions. Right rotator cuff repair. CT ABDOMEN AND PELVIS Aabdominal wall: Small fatty umbilical hernia. Abdominal wall subcutaneous gas from medication injection Hepatobiliary: No focal liver abnormality.No evidence of biliary obstruction or stone. Pancreas: Unremarkable. Spleen: Unremarkable. Adrenals/Urinary Tract: Negative adrenals. No hydronephrosis or stone. Unremarkable bladder. Reproductive:Globular appearance of the uterus with indistinct endometrium and probable tiny cyst. Dimensions are similar to pelvic ultrasound 12/28/2014 when the endometrium had a normal appearance. There is a sub serosal an exophytic fibroid from the lower uterine segment measuring 21 mm. Stomach/Bowel:  No obstruction. No appendicitis. Vascular/Lymphatic: No acute vascular abnormality. No mass or adenopathy. Peritoneal: No ascites or pneumoperitoneum. Musculoskeletal: No  contributory finding. IMPRESSION: 1. No explanation for fever. No signs of infection and no adenopathy. 2. Globular enlargement of the uterus similar to 12/28/2014 pelvic ultrasound and suggesting adenomyosis. Exophytic uterine fibroid. Electronically Signed   By: Monte Fantasia M.D.   On: 03/05/2016 15:38      Assessment/Plan:  INTERVAL HISTORY:  Worsening leukopenia and TTPEnia and now menstrual bleeding  03/06/16:  WBC and ANC up now sp granix, Platelets still going down and LFT's up  Principal Problem:   Neutropenic fever ([place]) Active Problems:   Pyelonephritis   Leukopenia   Hyponatremia   Anemia, iron deficiency   Sepsis ([place])   Drug-induced neutropenia ([facility])   LFTs abnormal   Ehrlichiosis   FUO (fever of  unknown origin)    Kim Gonzales is a 45 y.o. female with  with admission with febrile neutropenia, TTPenia, now worsening hepatitis  #1 Febrile neutropenia:  Fevers are better and ANC now better no longer neutropenia though this is due to the granix  Since no longer neutropenic and not any target for carbapenem, will dc imipenem  Differential for her febrile neutropenic condition include Ehrlichiosis given tick exposure, RMSF.  Bactrim induced myelosuppression aslo being considered though strange that [place] really  worsened despite this abx being stopped on Tuesday  The final possibillity is that she has a new myelodysplastic or other pathology in bone marrow and that ALL of her ID symptoms are due to a primary bone marrow issue   Continue doxycyline  followup cultures serologies  We also tried to send Leptospirosis from [facility] and I believe PCR assay being sent. I dont think lepto fits very well though she had moise proiblem  #2 Transamnitis: again makes one think about Ehrlichiia, RMSF, Leptospirosis though lepto is typically with a disproportionately elevated bilirubin. I agree with checking hepatitis serologies. I will also add CMV and EBV titers for tomorrows  am labs  #3 Thrombocytopenia: this has worsened. I would keep the patient through tomorrow despite her feeling better to recheck platelets and ensure LFT's also better or stabilizing at minimum  I spent greater than 35 minutes with the patient including greater than 50% of time in face to face counsel of the patient her husband re febrile neutropenia, TTPenia, workup for her fevers and cytopenias  and in coordination of her  care.  Dr. Megan Salon is covering this weekend for questions.    LOS: 2 days   Alcide Evener 03/06/2016, 3:50 PM

## 2016-03-06 NOTE — Progress Notes (Signed)
PROGRESS NOTE    Kim Gonzales  [id number] DOB: 10/25/1971 DOA: 03/03/2016 PCP: Lynne Logan, MD  Brief Narrative: 45 y.o. female with medical history significant for iron deficiency anemia and menorrhagia status post D&C who presents to the ED at [facility] for evaluation of fevers, chills, and right flank pain despite 10 days of antibiotic therapy for UTI. She did not seek medical evaluation until approximately 10 days prior to her admission. At that time, the patient was started on empiric therapy with Keflex for suspected UTI. Culture was obtained and grew Escherichia coli, resistant to penicillins and cephalosporins, and sensitive to Bactrim, fluoroquinolones, and Macrodantin. Therapy was switched to ciprofloxacin at that time. Course was completed with no appreciable improvement in her symptoms and she was placed on Bactrim which she has taken for the past 3 days. She was complaining of right side flank pain and lower abdominal pain on admission. She also report headaches. Patient report tick bites and exposure to mice.   Patient febrile, with neutropenic, mild thrombocytopenia.   Assessment & Plan:   Principal Problem:   Neutropenic fever ([place]) Active Problems:   Pyelonephritis   Leukopenia   Hyponatremia   Anemia, iron deficiency   Sepsis (HCC)   Drug-induced neutropenia (HCC)   LFTs abnormal   Ehrlichiosis   FUO (fever of unknown origin)  1-Neutropenic fever;  Resolved.  Patient presents with fever, neutropenia, recently treated for UTI ( Urine culture from PCP grew E coli 25,000 only).  Neutropenic precaution.  Doxy day 3. Discussed case with Dr Alvy Bimler, Neutropenia might be related to recent antibiotics exposure vs infection.  CT negative for pyelonephritis.    2-Sepsis;  Presents with fever, neutropenia, source unclear, UTI, vs tick exposure.  Continue with Doxy, discontinue primaxin Blood culture 5-09  no growth to date.  Blood culture 5-11 Chest  x ray negative ID consulted, appreciated Dr Lucianne Lei dam help.  BP above 100, lactic acid normal.  Leptospirosis antigen urine ordered, Ehrlichia and RMSF antibody pending.  Plan to transfer to med-surgery bed. And repeat labs in am.   Transaminases;  Mild elevation. Might be related to acute viral, bacterial  Illness. Repeat labs in am.  Check hepatitis panel.   3-Constipation; Miralax. Had BM today   4-Anemia; continue with ferrous sulfate.  Hb stable  DVT prophylaxis: hold lovenox due to thrombocytopenia, scd ordered.  Code Status: full code.  Family Communication: care discussed with patient and husband who was at bedside.  Disposition Plan: transfer to med-surgery    Consultants:   ID   Hematology   Procedures:   none  Antimicrobials:  Primaxin 5-10---5-12 Doxy 5-10   Subjective: She is feeling better. Cramps better. Having her menstrual period.  Had some nausea yesterday, had BM this am.   Objective: Filed Vitals:   03/06/16 0300 03/06/16 0400 03/06/16 0500 03/06/16 0600  BP: 114/62 110/53 115/63 121/71  Pulse:      Temp:      TempSrc:      Resp: 18 17 16 18   Height:      Weight:      SpO2: 100% 100%  100%    Intake/Output Summary (Last 24 hours) at 03/06/16 0804 Last data filed at 03/06/16 0611  Gross per 24 hour  Intake   2850 ml  Output   3000 ml  Net   -150 ml   Filed Weights   03/03/16 1113 03/03/16 1823  Weight: 71.487 kg (157 lb 9.6 oz) 71.8 kg (158  lb 4.6 oz)    Examination:  General exam: Appears calm and comfortable  Respiratory system: Clear to auscultation. Respiratory effort normal. Cardiovascular system: S1 & S2 heard, RRR. No JVD, murmurs, rubs, gallops or clicks. No pedal edema. Gastrointestinal system: Abdomen is nondistended, soft and nontender. No organomegaly or masses felt. Normal bowel sounds heard. Central nervous system: Alert and oriented. No focal neurological deficits. Extremities: Symmetric 5 x 5 power. Skin: No  rashes, lesions or ulcers    Data Reviewed: I have personally reviewed following labs and imaging studies  CBC:  Recent Labs Lab 03/03/16 1230 03/04/16 0513 03/04/16 0800 03/05/16 0309 03/06/16 0313  WBC 2.0* 0.9* 0.8* 0.6* 3.8*  NEUTROABS 1.5* 0.7* 0.5* 0.3* 3.3  HGB 13.2 11.5* 11.9* 10.8* 11.3*  HCT 39.5 33.9* 35.7* 31.8* 32.8*  MCV 79.6 79.4 80.0 78.9 78.7  PLT 172 120* 112* 85* 75*   Basic Metabolic Panel:  Recent Labs Lab 03/03/16 1230 03/04/16 0513 03/04/16 0800 03/05/16 0309 03/06/16 0313  NA 134* 137 135 137 140  K 3.6 3.6 4.1 3.5 3.7  CL 105 110 107 109 108  CO2 24 18* 21* 21* 23  GLUCOSE 103* 106* 101* 114* 102*  BUN 8 7 7  <5* 6  CREATININE 1.03* 0.85 0.92 0.72 0.77  CALCIUM 9.1 8.0* 8.2* 8.0* 8.4*   GFR: Estimated Creatinine Clearance: 87.1 mL/min (by C-G formula based on Cr of 0.77). Liver Function Tests:  Recent Labs Lab 03/03/16 1230 03/04/16 0513 03/04/16 0800 03/05/16 0309 03/06/16 0313  AST 44* 54* 55* 60* 127*  ALT 35 45 46 50 91*  ALKPHOS 38 44 43 52 92  BILITOT 0.3 0.5 0.5 0.4 0.6  PROT 7.6 5.5* 5.5* 5.1* 5.0*  ALBUMIN 4.4 3.2* 3.3* 3.1* 3.1*    Recent Labs Lab 03/03/16 1230  LIPASE 43   No results for input(s): AMMONIA in the last 168 hours. Coagulation Profile:  Recent Labs Lab 03/03/16 1913 03/04/16 0800  INR 1.20 1.35   Cardiac Enzymes: No results for input(s): CKTOTAL, CKMB, CKMBINDEX, TROPONINI in the last 168 hours. BNP (last 3 results) No results for input(s): PROBNP in the last 8760 hours. HbA1C: No results for input(s): HGBA1C in the last 72 hours. CBG:  Recent Labs Lab 03/04/16 0722 03/05/16 0808  GLUCAP 107* 114*   Lipid Profile: No results for input(s): CHOL, HDL, LDLCALC, TRIG, CHOLHDL, LDLDIRECT in the last 72 hours. Thyroid Function Tests: No results for input(s): TSH, T4TOTAL, FREET4, T3FREE, THYROIDAB in the last 72 hours. Anemia Panel:  Recent Labs  03/04/16 0800  VITAMINB12 546    Urine analysis:    Component Value Date/Time   COLORURINE YELLOW 03/03/2016 [place] 03/03/2016 1205   LABSPEC 1.011 03/03/2016 1205   PHURINE 6.0 03/03/2016 1205   GLUCOSEU NEGATIVE 03/03/2016 1205   HGBUR MODERATE* 03/03/2016 1205   BILIRUBINUR NEGATIVE 03/03/2016 1205   [place] 03/03/2016 1205   PROTEINUR NEGATIVE 03/03/2016 1205   NITRITE NEGATIVE 03/03/2016 1205   LEUKOCYTESUR NEGATIVE 03/03/2016 1205   Sepsis Labs:  Recent Labs Lab 03/03/16 1913 03/03/16 2150 03/04/16 0800 03/04/16 1022  PROCALCITON 0.16  --  0.18  --   LATICACIDVEN 0.8 2.3* 0.8 0.9    Recent Results (from the past 240 hour(s))  Urine culture     Status: Abnormal   Collection Time: 03/03/16 12:05 PM  Result Value Ref Range Status   Specimen Description URINE, CLEAN CATCH  Final   Special Requests NONE  Final  Culture (A)  Final    2,000 COLONIES/mL INSIGNIFICANT GROWTH Performed at [facility]    Report Status 03/05/2016 FINAL  Final  Culture, blood (Routine X 2) w Reflex to ID Panel     Status: None (Preliminary result)   Collection Time: 03/03/16 12:20 PM  Result Value Ref Range Status   Specimen Description BLOOD RIGHT ANTECUBITAL  Final   Special Requests BOTTLES DRAWN AEROBIC AND ANAEROBIC  5CC  Final   Culture   Final    NO GROWTH 2 DAYS Performed at [facility]    Report Status PENDING  Incomplete  Culture, blood (Routine X 2) w Reflex to ID Panel     Status: None (Preliminary result)   Collection Time: 03/03/16  2:05 PM  Result Value Ref Range Status   Specimen Description BLOOD RIGHT HAND  Final   Special Requests BOTTLES DRAWN AEROBIC AND ANAEROBIC  5CC  Final   Culture   Final    NO GROWTH 2 DAYS Performed at [facility]    Report Status PENDING  Incomplete  MRSA PCR Screening     Status: None   Collection Time: 03/04/16 11:29 AM  Result Value Ref Range Status   MRSA by PCR NEGATIVE NEGATIVE Final    Comment:         The GeneXpert MRSA Assay (FDA approved for NASAL specimens only), is one component of a comprehensive MRSA colonization surveillance program. It is not intended to diagnose MRSA infection nor to guide or monitor treatment for MRSA infections.   Culture, blood (x 2)     Status: None (Preliminary result)   Collection Time: 03/05/16 12:55 PM  Result Value Ref Range Status   Specimen Description BLOOD BLOOD LEFT HAND  Final   Special Requests BOTTLES DRAWN AEROBIC ONLY 1.5CC  Final   Culture PENDING  Incomplete   Report Status PENDING  Incomplete         Radiology Studies: Ct Chest W Contrast  03/05/2016  CLINICAL DATA:  Febrile neutropenia. EXAM: CT CHEST, ABDOMEN, AND PELVIS WITH CONTRAST TECHNIQUE: Multidetector CT imaging of the chest, abdomen and pelvis was performed following the standard protocol during bolus administration of intravenous contrast. CONTRAST:  120mL ISOVUE-300 IOPAMIDOL (ISOVUE-300) INJECTION 61% COMPARISON:  None. FINDINGS: CT CHEST THORACIC INLET/BODY WALL: No acute abnormality. MEDIASTINUM: Normal heart size. No pericardial effusion. No acute vascular abnormality. Incidental aberrant right subclavian artery. No adenopathy. LUNG WINDOWS: No consolidation.  No effusion.  No suspicious pulmonary nodule. UPPER ABDOMEN: No acute findings. OSSEOUS: No acute fracture. No suspicious lytic or blastic lesions. Right rotator cuff repair. CT ABDOMEN AND PELVIS Aabdominal wall: Small fatty umbilical hernia. Abdominal wall subcutaneous gas from medication injection Hepatobiliary: No focal liver abnormality.No evidence of biliary obstruction or stone. Pancreas: Unremarkable. Spleen: Unremarkable. Adrenals/Urinary Tract: Negative adrenals. No hydronephrosis or stone. Unremarkable bladder. Reproductive:Globular appearance of the uterus with indistinct endometrium and probable tiny cyst. Dimensions are similar to pelvic ultrasound 12/28/2014 when the endometrium had a normal  appearance. There is a sub serosal an exophytic fibroid from the lower uterine segment measuring 21 mm. Stomach/Bowel:  No obstruction. No appendicitis. Vascular/Lymphatic: No acute vascular abnormality. No mass or adenopathy. Peritoneal: No ascites or pneumoperitoneum. Musculoskeletal: No contributory finding. IMPRESSION: 1. No explanation for fever. No signs of infection and no adenopathy. 2. Globular enlargement of the uterus similar to 12/28/2014 pelvic ultrasound and suggesting adenomyosis. Exophytic uterine fibroid. Electronically Signed   By: Monte Fantasia M.D.   On: 03/05/2016  15:38   Ct Abdomen Pelvis W Contrast  03/05/2016  CLINICAL DATA:  Febrile neutropenia. EXAM: CT CHEST, ABDOMEN, AND PELVIS WITH CONTRAST TECHNIQUE: Multidetector CT imaging of the chest, abdomen and pelvis was performed following the standard protocol during bolus administration of intravenous contrast. CONTRAST:  133mL ISOVUE-300 IOPAMIDOL (ISOVUE-300) INJECTION 61% COMPARISON:  None. FINDINGS: CT CHEST THORACIC INLET/BODY WALL: No acute abnormality. MEDIASTINUM: Normal heart size. No pericardial effusion. No acute vascular abnormality. Incidental aberrant right subclavian artery. No adenopathy. LUNG WINDOWS: No consolidation.  No effusion.  No suspicious pulmonary nodule. UPPER ABDOMEN: No acute findings. OSSEOUS: No acute fracture. No suspicious lytic or blastic lesions. Right rotator cuff repair. CT ABDOMEN AND PELVIS Aabdominal wall: Small fatty umbilical hernia. Abdominal wall subcutaneous gas from medication injection Hepatobiliary: No focal liver abnormality.No evidence of biliary obstruction or stone. Pancreas: Unremarkable. Spleen: Unremarkable. Adrenals/Urinary Tract: Negative adrenals. No hydronephrosis or stone. Unremarkable bladder. Reproductive:Globular appearance of the uterus with indistinct endometrium and probable tiny cyst. Dimensions are similar to pelvic ultrasound 12/28/2014 when the endometrium had a normal  appearance. There is a sub serosal an exophytic fibroid from the lower uterine segment measuring 21 mm. Stomach/Bowel:  No obstruction. No appendicitis. Vascular/Lymphatic: No acute vascular abnormality. No mass or adenopathy. Peritoneal: No ascites or pneumoperitoneum. Musculoskeletal: No contributory finding. IMPRESSION: 1. No explanation for fever. No signs of infection and no adenopathy. 2. Globular enlargement of the uterus similar to 12/28/2014 pelvic ultrasound and suggesting adenomyosis. Exophytic uterine fibroid. Electronically Signed   By: Monte Fantasia M.D.   On: 03/05/2016 15:38   Dg Chest Port 1 View  03/04/2016  CLINICAL DATA:  Sepsis. EXAM: PORTABLE CHEST 1 VIEW COMPARISON:  None. FINDINGS: The heart size and mediastinal contours are within normal limits. There is no evidence of pulmonary edema, consolidation, pneumothorax, nodule or pleural fluid. The visualized skeletal structures are unremarkable. IMPRESSION: No active disease. Electronically Signed   By: Aletta Edouard M.D.   On: 03/04/2016 08:34        Scheduled Meds: . doxycycline  100 mg Oral Q12H  . feeding supplement (ENSURE ENLIVE)  237 mL Oral TID BM  . ferrous sulfate  325 mg Oral Q breakfast  . imipenem-cilastatin  500 mg Intravenous Q6H  . polyethylene glycol  17 g Oral BID   Continuous Infusions: . sodium chloride 100 mL/hr at 03/05/16 1500     LOS: 2 days    Time spent: 25 minutes.     Elmarie Shiley, MD [facility] Pager [phone]  If 7PM-7AM, please contact night-coverage [email or web address] Password [facility] 03/06/2016, 8:04 AM

## 2016-03-07 LAB — HEPATITIS PANEL, ACUTE
HCV Ab: <0.1 s/co ratio (ref 0.0–0.9)
HEP B S AG: NEGATIVE
Hep A IgM: NEGATIVE
Hep B C IgM: NEGATIVE

## 2016-03-07 LAB — COMPREHENSIVE METABOLIC PANEL WITH GFR
ALT: 82 U/L — ABNORMAL HIGH (ref 14–54)
AST: 91 U/L — ABNORMAL HIGH (ref 15–41)
Albumin: 3 g/dL — ABNORMAL LOW (ref 3.5–5.0)
Alkaline Phosphatase: 97 U/L (ref 38–126)
Anion gap: 9 (ref 5–15)
BUN: 7 mg/dL (ref 6–20)
CO2: 26 mmol/L (ref 22–32)
Calcium: 8.5 mg/dL — ABNORMAL LOW (ref 8.9–10.3)
Chloride: 106 mmol/L (ref 101–111)
Creatinine, Ser: 0.65 mg/dL (ref 0.44–1.00)
GFR calc Af Amer: >60 mL/min
GFR calc non Af Amer: >60 mL/min
Glucose, Bld: 104 mg/dL — ABNORMAL HIGH (ref 65–99)
Potassium: 3.8 mmol/L (ref 3.5–5.1)
Sodium: 141 mmol/L (ref 135–145)
Total Bilirubin: 0.3 mg/dL (ref 0.3–1.2)
Total Protein: 4.9 g/dL — ABNORMAL LOW (ref 6.5–8.1)

## 2016-03-07 LAB — CBC WITH DIFFERENTIAL/PLATELET
BASOS ABS: 0 10*3/uL (ref 0.0–0.1)
Basophils Relative: 0 %
Eosinophils Absolute: 0 10*3/uL (ref 0.0–0.7)
Eosinophils Relative: 1 %
HEMATOCRIT: 31.1 % — AB (ref 36.0–46.0)
HEMOGLOBIN: 10.7 g/dL — AB (ref 12.0–15.0)
LYMPHS ABS: 1.4 10*3/uL (ref 0.7–4.0)
LYMPHS PCT: 24 %
MCH: 26.9 pg (ref 26.0–34.0)
MCHC: 34.4 g/dL (ref 30.0–36.0)
MCV: 78.1 fL (ref 78.0–100.0)
Monocytes Absolute: 0.3 10*3/uL (ref 0.1–1.0)
Monocytes Relative: 6 %
NEUTROS ABS: 4 10*3/uL (ref 1.7–7.7)
NEUTROS PCT: 70 %
Platelets: 111 10*3/uL — ABNORMAL LOW (ref 150–400)
RBC: 3.98 MIL/uL (ref 3.87–5.11)
RDW: 14.9 % (ref 11.5–15.5)
WBC: 5.7 10*3/uL (ref 4.0–10.5)

## 2016-03-07 LAB — GLUCOSE, CAPILLARY: GLUCOSE-CAPILLARY: 83 mg/dL (ref 65–99)

## 2016-03-07 MED ORDER — TRAMADOL HCL 50 MG PO TABS: 50.0000 mg | ORAL_TABLET | Freq: Three times a day (TID) | ORAL | Status: DC | PRN

## 2016-03-07 MED ORDER — BACID PO TABS
2.0000 | ORAL_TABLET | Freq: Two times a day (BID) | ORAL | Status: DC
  Filled 2016-03-07 (×2): qty 2

## 2016-03-07 MED ORDER — BACID PO TABS: 2.0000 | ORAL_TABLET | Freq: Two times a day (BID) | ORAL | Status: DC

## 2016-03-07 MED ORDER — DOXYCYCLINE HYCLATE 100 MG PO TABS: 100.0000 mg | ORAL_TABLET | Freq: Two times a day (BID) | ORAL | Status: DC

## 2016-03-07 NOTE — Progress Notes (Signed)
Patient discharged to home, all discharge medications and instructions reviewed and questions answered. Patient to be assisted to vehicle by wheelchair when ride arrives.  

## 2016-03-07 NOTE — Discharge Summary (Signed)
Physician Discharge Summary  Kim Gonzales [id number] DOB: [birth year] DOA: 03/03/2016  PCP: Lynne Logan, MD  Admit date: 03/03/2016 Discharge date: 03/07/2016  Time spent: 35 minutes  Recommendations for Outpatient Follow-up:  Please follow up Blood culture, Leptospirosis in urine, erlichiosis, CMV virus.  Needs Repeat CBC to monitor leukopenia. Needs repeat LFT.    Discharge Diagnoses:    Neutropenic fever (HCC)   Leukopenia   Hyponatremia   Anemia, iron deficiency   Sepsis (HCC)   Drug-induced neutropenia (HCC)   LFTs abnormal   Ehrlichiosis   FUO (fever of unknown origin)   Discharge Condition: stable  Diet recommendation: heart healthy   Filed Weights   03/03/16 1113 03/03/16 1823  Weight: 71.487 kg (157 lb 9.6 oz) 71.8 kg (158 lb 4.6 oz)    History of present illness:  45 y.o. female with medical history significant for iron deficiency anemia and menorrhagia status post D&C who presents to the ED at [facility] for evaluation of fevers, chills, and right flank pain despite 10 days of antibiotic therapy for UTI. She did not seek medical evaluation until approximately 10 days prior to her admission. At that time, the patient was started on empiric therapy with Keflex for suspected UTI. Culture was obtained and grew Escherichia coli, resistant to penicillins and cephalosporins, and sensitive to Bactrim, fluoroquinolones, and Macrodantin. Therapy was switched to ciprofloxacin at that time. Course was completed with no appreciable improvement in her symptoms and she was placed on Bactrim which she has taken for the past 3 days. She was complaining of right side flank pain and lower abdominal pain on admission. She also report headaches. Patient report tick bites and exposure to mice.   Hospital Course:  1-Neutropenic fever; Resolved.  Patient presents with fever, neutropenia, recently treated for UTI ( Urine culture from PCP grew E coli 25,000 only).   Neutropenic precaution.  Doxy day 3. Discussed case with Dr Alvy Bimler, Neutropenia might be related to recent antibiotics exposure vs infection.  CT negative for pyelonephritis.  Needs repeat CBC in 1 week and 1 month.  Please see ID PN for the same.   2-Sepsis;  Presents with fever, neutropenia, source unclear, UTI, vs tick exposure. (erlichiosis)  Continue with Doxy, discontinue primaxin Blood culture 5-09 no growth to date.  Blood culture 5-11 no growth to date Chest x ray negative ID consulted, appreciated Dr Lucianne Lei dam help.  BP above 100, lactic acid normal.  Leptospirosis antigen urine ordered, Ehrlichia pending  and RMSF antibody negative Thrombocytopenia and transaminases improved, afebrile. Plan to discharge on Doxycycline to complete 10 days treatment.   Transaminases;  Mild elevation. Might be related to acute viral, bacterial Illness.  hepatitis panel pending  Trending down.   3-Constipation; Miralax.had multiple BM last night. Watery stool this am after laxative.  i advised patient to stop taking laxatives, if diarrhea persist she will need evaluation.   4-Anemia; continue with ferrous sulfate.  Hb stable  Procedures:  none  Consultations:  ID, Dr Lucianne Lei dam  Dr Alvy Bimler   Discharge Exam: Filed Vitals:   03/06/16 2156 03/07/16 0646  BP: 118/82 121/87  Pulse: 62 65  Temp: 97.6 F (36.4 C) 97.7 F (36.5 C)  Resp: 18 18    General: NAD Cardiovascular: S 1, S 2 RRR Respiratory: CTA  Discharge Instructions   Discharge Instructions    Diet - low sodium heart healthy    Complete by:  As directed      Increase activity slowly  Complete by:  As directed           Current Discharge Medication List    START taking these medications   Details  doxycycline (VIBRA-TABS) 100 MG tablet Take 1 tablet (100 mg total) by mouth every 12 (twelve) hours. Qty: 14 tablet, Refills: 0    lactobacillus acidophilus (BACID) TABS tablet Take 2 tablets by  mouth 2 (two) times daily. Qty: 30 tablet, Refills: 0    traMADol (ULTRAM) 50 MG tablet Take 1 tablet (50 mg total) by mouth every 8 (eight) hours as needed for moderate pain. Qty: 30 tablet, Refills: 0      CONTINUE these medications which have NOT CHANGED   Details  ferrous sulfate 325 (65 FE) MG tablet Take 325 mg by mouth daily with breakfast.      STOP taking these medications     acetaminophen (TYLENOL) 500 MG tablet      sulfamethoxazole-trimethoprim (BACTRIM DS,SEPTRA DS) 800-160 MG tablet      doxycycline (VIBRAMYCIN) 50 MG capsule      HYDROcodone-acetaminophen (NORCO/VICODIN) 5-325 MG tablet        No Known Allergies Follow-up Information    Follow up with Lynne Logan, MD In 3 days.   Specialty:  Family Medicine   Contact information:   [place] [address] [phone]       Follow up with Alcide Evener, MD In 1 week.   Specialty:  Infectious Diseases   Contact information:   [address] [place] [address] [phone]        The results of significant diagnostics from this hospitalization (including imaging, microbiology, ancillary and laboratory) are listed below for reference.    Significant Diagnostic Studies: Ct Chest W Contrast  03/05/2016  CLINICAL DATA:  Febrile neutropenia. EXAM: CT CHEST, ABDOMEN, AND PELVIS WITH CONTRAST TECHNIQUE: Multidetector CT imaging of the chest, abdomen and pelvis was performed following the standard protocol during bolus administration of intravenous contrast. CONTRAST:  18mL ISOVUE-300 IOPAMIDOL (ISOVUE-300) INJECTION 61% COMPARISON:  None. FINDINGS: CT CHEST THORACIC INLET/BODY WALL: No acute abnormality. MEDIASTINUM: Normal heart size. No pericardial effusion. No acute vascular abnormality. Incidental aberrant right subclavian artery. No adenopathy. LUNG WINDOWS: No consolidation.  No effusion.  No suspicious pulmonary nodule. UPPER ABDOMEN: No acute  findings. OSSEOUS: No acute fracture. No suspicious lytic or blastic lesions. Right rotator cuff repair. CT ABDOMEN AND PELVIS Aabdominal wall: Small fatty umbilical hernia. Abdominal wall subcutaneous gas from medication injection Hepatobiliary: No focal liver abnormality.No evidence of biliary obstruction or stone. Pancreas: Unremarkable. Spleen: Unremarkable. Adrenals/Urinary Tract: Negative adrenals. No hydronephrosis or stone. Unremarkable bladder. Reproductive:Globular appearance of the uterus with indistinct endometrium and probable tiny cyst. Dimensions are similar to pelvic ultrasound 12/28/2014 when the endometrium had a normal appearance. There is a sub serosal an exophytic fibroid from the lower uterine segment measuring 21 mm. Stomach/Bowel:  No obstruction. No appendicitis. Vascular/Lymphatic: No acute vascular abnormality. No mass or adenopathy. Peritoneal: No ascites or pneumoperitoneum. Musculoskeletal: No contributory finding. IMPRESSION: 1. No explanation for fever. No signs of infection and no adenopathy. 2. Globular enlargement of the uterus similar to 12/28/2014 pelvic ultrasound and suggesting adenomyosis. Exophytic uterine fibroid. Electronically Signed   By: Monte Fantasia M.D.   On: 03/05/2016 15:38   Ct Abdomen Pelvis W Contrast  03/05/2016  CLINICAL DATA:  Febrile neutropenia. EXAM: CT CHEST, ABDOMEN, AND PELVIS WITH CONTRAST TECHNIQUE: Multidetector CT imaging of the chest, abdomen and  pelvis was performed following the standard protocol during bolus administration of intravenous contrast. CONTRAST:  198mL ISOVUE-300 IOPAMIDOL (ISOVUE-300) INJECTION 61% COMPARISON:  None. FINDINGS: CT CHEST THORACIC INLET/BODY WALL: No acute abnormality. MEDIASTINUM: Normal heart size. No pericardial effusion. No acute vascular abnormality. Incidental aberrant right subclavian artery. No adenopathy. LUNG WINDOWS: No consolidation.  No effusion.  No suspicious pulmonary nodule. UPPER ABDOMEN: No acute  findings. OSSEOUS: No acute fracture. No suspicious lytic or blastic lesions. Right rotator cuff repair. CT ABDOMEN AND PELVIS Aabdominal wall: Small fatty umbilical hernia. Abdominal wall subcutaneous gas from medication injection Hepatobiliary: No focal liver abnormality.No evidence of biliary obstruction or stone. Pancreas: Unremarkable. Spleen: Unremarkable. Adrenals/Urinary Tract: Negative adrenals. No hydronephrosis or stone. Unremarkable bladder. Reproductive:Globular appearance of the uterus with indistinct endometrium and probable tiny cyst. Dimensions are similar to pelvic ultrasound 12/28/2014 when the endometrium had a normal appearance. There is a sub serosal an exophytic fibroid from the lower uterine segment measuring 21 mm. Stomach/Bowel:  No obstruction. No appendicitis. Vascular/Lymphatic: No acute vascular abnormality. No mass or adenopathy. Peritoneal: No ascites or pneumoperitoneum. Musculoskeletal: No contributory finding. IMPRESSION: 1. No explanation for fever. No signs of infection and no adenopathy. 2. Globular enlargement of the uterus similar to 12/28/2014 pelvic ultrasound and suggesting adenomyosis. Exophytic uterine fibroid. Electronically Signed   By: Monte Fantasia M.D.   On: 03/05/2016 15:38   Dg Chest Port 1 View  03/04/2016  CLINICAL DATA:  Sepsis. EXAM: PORTABLE CHEST 1 VIEW COMPARISON:  None. FINDINGS: The heart size and mediastinal contours are within normal limits. There is no evidence of pulmonary edema, consolidation, pneumothorax, nodule or pleural fluid. The visualized skeletal structures are unremarkable. IMPRESSION: No active disease. Electronically Signed   By: Aletta Edouard M.D.   On: 03/04/2016 08:34   Ct Renal Stone Study  03/03/2016  CLINICAL DATA:  Right flank pain for 1 month EXAM: CT ABDOMEN AND PELVIS WITHOUT CONTRAST TECHNIQUE: Multidetector CT imaging of the abdomen and pelvis was performed following the standard protocol without IV contrast.  COMPARISON:  Pelvic ultrasound 12/28/2014 FINDINGS: Lower chest:  The lung bases are unremarkable. Hepatobiliary: Unenhanced liver shows no biliary ductal dilatation. No calcified gallstones are noted within gallbladder. No CBD dilatation. Pancreas: Unenhanced pancreas is unremarkable. Spleen: Unenhanced spleen is unremarkable. Adrenals/Urinary Tract: No adrenal gland mass. Unenhanced kidneys are symmetrical in size. Nephrolithiasis. No hydronephrosis or hydroureter. No calcified ureteral calculi are noted. No calcified calculi are noted within under distended urinary bladder. Stomach/Bowel: There is no gastric outlet obstruction. The study is limited without IV or oral contrast. No small bowel obstruction. Abundant stool is noted in right colon and transverse colon. Abundant stool is noted within cecum. There is a low lying cecum. Normal appendix clearly visualized in coronal image 36. Moderate stool noted within and descending colon. Moderate stool noted within distal sigmoid colon. Moderate stool and gas noted within rectum. Vascular/Lymphatic: There is no aortic aneurysm. Pelvic phleboliths are noted. No retroperitoneal or mesenteric adenopathy. Reproductive: There is again noted retroverted uterus. There is anterior pedunculated uterine fibroid measures about 2.2 cm. No adnexal masses noted. No pelvic free fluid. Other: No ascites or free air. Musculoskeletal: No destructive bony lesions are noted. Sagittal images of the spine shows significant disc space flattening with vacuum disc phenomenon at L4-L5 level. Mild disc space flattening minimal posterior disc bulge at L3-L4 level. Mild posterior disc bulge at L5-S1 level. No destructive bony lesions are noted within pelvis. IMPRESSION: 1. There is no evidence of nephrolithiasis. No  hydronephrosis or hydroureter. No calcified ureteral calculi. 2. Abundant stool noted in right colon and redundant transverse colon. There is a low lying cecum. Abundant stool noted  within cecum. No pericecal inflammation. Normal appendix. 3. Retroverted uterus. Anterior uterine fibroid measures at least 2.2 cm. No adnexal mass. No pelvic free fluid. 4. Degenerative changes lumbar spine. 5. No small bowel obstruction. Electronically Signed   By: Lahoma Crocker M.D.   On: 03/03/2016 13:33    Microbiology: Recent Results (from the past 240 hour(s))  Urine culture     Status: Abnormal   Collection Time: 03/03/16 12:05 PM  Result Value Ref Range Status   Specimen Description URINE, CLEAN CATCH  Final   Special Requests NONE  Final   Culture (A)  Final    2,000 COLONIES/mL INSIGNIFICANT GROWTH Performed at [facility]    Report Status 03/05/2016 FINAL  Final  Culture, blood (Routine X 2) w Reflex to ID Panel     Status: None (Preliminary result)   Collection Time: 03/03/16 12:20 PM  Result Value Ref Range Status   Specimen Description BLOOD RIGHT ANTECUBITAL  Final   Special Requests BOTTLES DRAWN AEROBIC AND ANAEROBIC  5CC  Final   Culture   Final    NO GROWTH 3 DAYS Performed at [facility]    Report Status PENDING  Incomplete  Culture, blood (Routine X 2) w Reflex to ID Panel     Status: None (Preliminary result)   Collection Time: 03/03/16  2:05 PM  Result Value Ref Range Status   Specimen Description BLOOD RIGHT HAND  Final   Special Requests BOTTLES DRAWN AEROBIC AND ANAEROBIC  5CC  Final   Culture   Final    NO GROWTH 3 DAYS Performed at [facility]    Report Status PENDING  Incomplete  MRSA PCR Screening     Status: None   Collection Time: 03/04/16 11:29 AM  Result Value Ref Range Status   MRSA by PCR NEGATIVE NEGATIVE Final    Comment:        The GeneXpert MRSA Assay (FDA approved for NASAL specimens only), is one component of a comprehensive MRSA colonization surveillance program. It is not intended to diagnose MRSA infection nor to guide or monitor treatment for MRSA infections.   Culture, blood (x 2)     Status: None  (Preliminary result)   Collection Time: 03/05/16 12:07 PM  Result Value Ref Range Status   Specimen Description BLOOD BLOOD LEFT HAND  Final   Special Requests BOTTLES DRAWN AEROBIC ONLY 1CC  Final   Culture   Final    NO GROWTH 1 DAY Performed at [facility]    Report Status PENDING  Incomplete  Culture, blood (x 2)     Status: None (Preliminary result)   Collection Time: 03/05/16 12:55 PM  Result Value Ref Range Status   Specimen Description BLOOD BLOOD LEFT HAND  Final   Special Requests BOTTLES DRAWN AEROBIC ONLY 1.5CC  Final   Culture   Final    NO GROWTH 1 DAY Performed at [facility]    Report Status PENDING  Incomplete     Labs: Basic Metabolic Panel:  Recent Labs Lab 03/04/16 0513 03/04/16 0800 03/05/16 0309 03/06/16 0313 03/07/16 0335  NA 137 135 137 140 141  K 3.6 4.1 3.5 3.7 3.8  CL 110 107 109 108 106  CO2 18* 21* 21* 23 26  GLUCOSE 106* 101* 114* 102* 104*  BUN  7 7 <5* 6 7  CREATININE 0.85 0.92 0.72 0.77 0.65  CALCIUM 8.0* 8.2* 8.0* 8.4* 8.5*   Liver Function Tests:  Recent Labs Lab 03/04/16 0513 03/04/16 0800 03/05/16 0309 03/06/16 0313 03/07/16 0335  AST 54* 55* 60* 127* 91*  ALT 45 46 50 91* 82*  ALKPHOS 44 43 52 92 97  BILITOT 0.5 0.5 0.4 0.6 0.3  PROT 5.5* 5.5* 5.1* 5.0* 4.9*  ALBUMIN 3.2* 3.3* 3.1* 3.1* 3.0*    Recent Labs Lab 03/03/16 1230  LIPASE 43   No results for input(s): AMMONIA in the last 168 hours. CBC:  Recent Labs Lab 03/04/16 0513 03/04/16 0800 03/05/16 0309 03/06/16 0313 03/07/16 0335  WBC 0.9* 0.8* 0.6* 3.8* 5.7  NEUTROABS 0.7* 0.5* 0.3* 3.3 4.0  HGB 11.5* 11.9* 10.8* 11.3* 10.7*  HCT 33.9* 35.7* 31.8* 32.8* 31.1*  MCV 79.4 80.0 78.9 78.7 78.1  PLT 120* 112* 85* 75* 111*   Cardiac Enzymes: No results for input(s): CKTOTAL, CKMB, CKMBINDEX, TROPONINI in the last 168 hours. BNP: BNP (last 3 results) No results for input(s): BNP in the last 8760 hours.  ProBNP (last 3 results) No  results for input(s): PROBNP in the last 8760 hours.  CBG:  Recent Labs Lab 03/04/16 0722 03/05/16 0808 03/06/16 0839 03/07/16 0741  GLUCAP 107* 114* 116* 83       Signed:  Niel Hummer A MD.  [facility] 03/07/2016, 8:49 AM

## 2016-03-08 LAB — CULTURE, BLOOD (ROUTINE X 2)
CULTURE: NO GROWTH
Culture: NO GROWTH

## 2016-03-08 LAB — EPSTEIN-BARR VIRUS VCA ANTIBODY PANEL
EBV NA IgG: 192 U/mL — ABNORMAL HIGH (ref 0.0–17.9)
EBV VCA IGG: 78.9 U/mL — AB (ref 0.0–17.9)

## 2016-03-08 LAB — CMV ANTIBODY, IGG (EIA): CMV Ab - IgG: 3.3 U/mL — ABNORMAL HIGH (ref 0.00–0.59)

## 2016-03-08 LAB — CMV IGM: CMV IgM: <30 AU/mL (ref 0.0–29.9)

## 2016-03-09 LAB — EHRLICHIA ANTIBODY PANEL
E CHAFFEENSIS AB, IGG: NEGATIVE
E CHAFFEENSIS AB, IGM: NEGATIVE
E. CHAFFEENSIS IGG AB: NEGATIVE
E. Chaffeensis (HME) IgM Titer: NEGATIVE

## 2016-03-10 LAB — CULTURE, BLOOD (ROUTINE X 2)
CULTURE: NO GROWTH
CULTURE: NO GROWTH

## 2016-03-11 LAB — MISCELLANEOUS TEST

## 2016-03-12 ENCOUNTER — Other Ambulatory Visit (HOSPITAL_COMMUNITY)
Admission: RE | Admit: 2016-03-12 | Discharge: 2016-03-12 | Disposition: A | Discharge status: Home / Self Care | Payer: BLUE CROSS/BLUE SHIELD | Source: Ambulatory Visit | Attending: Orthopedic Surgery | Admitting: Orthopedic Surgery

## 2016-03-12 DIAGNOSIS — Z029 Encounter for administrative examinations, unspecified: Secondary | ICD-10-CM | POA: Diagnosis present

## 2016-03-12 LAB — DIFFERENTIAL
Basophils Absolute: 0 10*3/uL (ref 0.0–0.1)
Basophils Relative: 1 %
EOS PCT: 1 %
Eosinophils Absolute: 0.1 10*3/uL (ref 0.0–0.7)
LYMPHS PCT: 34 %
Lymphs Abs: 3 10*3/uL (ref 0.7–4.0)
Monocytes Absolute: 1.3 10*3/uL — ABNORMAL HIGH (ref 0.1–1.0)
Monocytes Relative: 14 %
NEUTROS ABS: 4.4 10*3/uL (ref 1.7–7.7)
NEUTROS PCT: 50 %

## 2016-03-12 LAB — COMPREHENSIVE METABOLIC PANEL
ALT: 72 U/L — AB (ref 14–54)
AST: 39 U/L (ref 15–41)
Albumin: 3.4 g/dL — ABNORMAL LOW (ref 3.5–5.0)
Alkaline Phosphatase: 77 U/L (ref 38–126)
Anion gap: 11 (ref 5–15)
BUN: 14 mg/dL (ref 6–20)
CHLORIDE: 106 mmol/L (ref 101–111)
CO2: 23 mmol/L (ref 22–32)
Calcium: 9.1 mg/dL (ref 8.9–10.3)
Creatinine, Ser: 0.78 mg/dL (ref 0.44–1.00)
GFR calc Af Amer: >60 mL/min (ref >60)
GFR calc non Af Amer: >60 mL/min (ref >60)
Glucose, Bld: 105 mg/dL — ABNORMAL HIGH (ref 65–99)
POTASSIUM: 4.5 mmol/L (ref 3.5–5.1)
SODIUM: 140 mmol/L (ref 135–145)
TOTAL PROTEIN: 6.3 g/dL — AB (ref 6.5–8.1)
Total Bilirubin: 0.6 mg/dL (ref 0.3–1.2)

## 2016-03-12 LAB — CBC
HCT: 37.8 % (ref 36.0–46.0)
Hemoglobin: 12.3 g/dL (ref 12.0–15.0)
MCH: 26 pg (ref 26.0–34.0)
MCHC: 32.5 g/dL (ref 30.0–36.0)
MCV: 79.9 fL (ref 78.0–100.0)
PLATELETS: 311 10*3/uL (ref 150–400)
RBC: 4.73 MIL/uL (ref 3.87–5.11)
RDW: 15.5 % (ref 11.5–15.5)
WBC: 8.5 10*3/uL (ref 4.0–10.5)

## 2016-03-26 ENCOUNTER — Encounter: Payer: Self-pay | Admitting: Infectious Disease

## 2016-03-26 ENCOUNTER — Ambulatory Visit (INDEPENDENT_AMBULATORY_CARE_PROVIDER_SITE_OTHER): Payer: BLUE CROSS/BLUE SHIELD | Admitting: Infectious Disease

## 2016-03-26 VITALS — BP 108/73 | HR 77 | Temp 97.9°F | Wt 162.0 lb

## 2016-03-26 DIAGNOSIS — R42 Dizziness and giddiness: Secondary | ICD-10-CM

## 2016-03-26 DIAGNOSIS — D72819 Decreased white blood cell count, unspecified: Secondary | ICD-10-CM

## 2016-03-26 DIAGNOSIS — R5081 Fever presenting with conditions classified elsewhere: Secondary | ICD-10-CM

## 2016-03-26 DIAGNOSIS — D709 Neutropenia, unspecified: Secondary | ICD-10-CM | POA: Diagnosis not present

## 2016-03-26 DIAGNOSIS — L298 Other pruritus: Secondary | ICD-10-CM

## 2016-03-26 DIAGNOSIS — R509 Fever, unspecified: Secondary | ICD-10-CM

## 2016-03-26 DIAGNOSIS — N898 Other specified noninflammatory disorders of vagina: Secondary | ICD-10-CM

## 2016-03-26 DIAGNOSIS — N941 Unspecified dyspareunia: Secondary | ICD-10-CM

## 2016-03-26 DIAGNOSIS — M791 Myalgia, unspecified site: Secondary | ICD-10-CM

## 2016-03-26 DIAGNOSIS — D702 Other drug-induced agranulocytosis: Secondary | ICD-10-CM | POA: Diagnosis not present

## 2016-03-26 DIAGNOSIS — R945 Abnormal results of liver function studies: Secondary | ICD-10-CM

## 2016-03-26 DIAGNOSIS — R7989 Other specified abnormal findings of blood chemistry: Secondary | ICD-10-CM | POA: Diagnosis not present

## 2016-03-26 HISTORY — DX: Unspecified dyspareunia: N94.10

## 2016-03-26 HISTORY — DX: Dizziness and giddiness: R42

## 2016-03-26 HISTORY — DX: Myalgia, unspecified site: M79.10

## 2016-03-26 HISTORY — DX: Other specified noninflammatory disorders of vagina: N89.8

## 2016-03-26 LAB — CBC WITH DIFFERENTIAL/PLATELET
BASOS ABS: 40 {cells}/uL (ref 0–200)
Basophils Relative: 1 %
EOS ABS: 40 {cells}/uL (ref 15–500)
EOS PCT: 1 %
HCT: 36.9 % (ref 35.0–45.0)
Hemoglobin: 11.7 g/dL (ref 11.7–15.5)
LYMPHS ABS: 1800 {cells}/uL (ref 850–3900)
Lymphocytes Relative: 45 %
MCH: 26.6 pg — AB (ref 27.0–33.0)
MCHC: 31.7 g/dL — ABNORMAL LOW (ref 32.0–36.0)
MCV: 83.9 fL (ref 80.0–100.0)
MONOS PCT: 9 %
MPV: 9.2 fL (ref 7.5–12.5)
Monocytes Absolute: 360 cells/uL (ref 200–950)
NEUTROS ABS: 1760 {cells}/uL (ref 1500–7800)
Neutrophils Relative %: 44 %
PLATELETS: 273 10*3/uL (ref 140–400)
RBC: 4.4 MIL/uL (ref 3.80–5.10)
RDW: 16.2 % — AB (ref 11.0–15.0)
WBC: 4 10*3/uL (ref 3.8–10.8)

## 2016-03-26 LAB — COMPLETE METABOLIC PANEL WITH GFR
ALK PHOS: 48 U/L (ref 33–115)
ALT: 21 U/L (ref 6–29)
AST: 26 U/L (ref 10–35)
Albumin: 4.2 g/dL (ref 3.6–5.1)
BILIRUBIN TOTAL: 0.5 mg/dL (ref 0.2–1.2)
BUN: 13 mg/dL (ref 7–25)
CO2: 18 mmol/L — AB (ref 20–31)
Calcium: 9.2 mg/dL (ref 8.6–10.2)
Chloride: 107 mmol/L (ref 98–110)
Creat: 1.08 mg/dL (ref 0.50–1.10)
GFR, EST AFRICAN AMERICAN: 71 mL/min (ref >=60)
GFR, EST NON AFRICAN AMERICAN: 62 mL/min (ref >=60)
GLUCOSE: 92 mg/dL (ref 65–99)
POTASSIUM: 4.9 mmol/L (ref 3.5–5.3)
SODIUM: 138 mmol/L (ref 135–146)
Total Protein: 6.6 g/dL (ref 6.1–8.1)

## 2016-03-26 LAB — ROCKY MTN SPOTTED FVR ABS PNL(IGG+IGM)
RMSF IgG: NOT DETECTED
RMSF IgM: NOT DETECTED

## 2016-03-26 LAB — SEDIMENTATION RATE: Sed Rate: 4 mm/hr (ref 0–20)

## 2016-03-26 LAB — C-REACTIVE PROTEIN: CRP: <0.5 mg/dL (ref <0.60)

## 2016-03-26 LAB — CK: Total CK: 76 U/L (ref 7–177)

## 2016-03-26 MED ORDER — FLUCONAZOLE 100 MG PO TABS: 100.0000 mg | ORAL_TABLET | Freq: Every day | ORAL | Status: DC

## 2016-03-26 NOTE — Progress Notes (Signed)
Chief complaint: light headedness with exertions, talking , pain with sex, vaginal pruritis, muscle aches, fatigue, sore throat   Subjective:    Patient ID: Kim Gonzales, female    DOB: 26-Feb-1971, 45 y.o.   MRN: [id number]  HPI  45 y.o. female who was admitted to [facility] on 03/03/16.  4 weeks prior to admission she had  developed dysuria, urinary frequency and hesitancy. She did not seek medical evaluation until approximately 10 days prior to her admission. At that time, the patient was started on empiric therapy with Keflex for suspected UTI. Culture was obtained and grew 10-25K Escherichia coli, which looks to be AN ESBL carbapenems, to Bactrim, fluoroquinolones, and Macrodantin. She was rx cipro but became flushed and thought she was having a reaction to this antibiotic and she was changed to oral bactrim which she took for 3 days. She then began having worsening of back and flank pain she had previously and now with intense chills and fevers. On arrival to ED she was noted to profoundly neutropenic and leukopenic. She also in the interim has been developing [place].  She has a mild elevation of AST but normal ALT. She had minimal hyponatremia.  CXR negative. CT renal stone sudy did not show stones or cause for infection. UA with 0-5 WBC, 6-30 RBC  She has been admitted and transferred to stepdown unit. She was started on imipenem and levaquin but has had worsening of her leukopenia and TTPenia.  She also relates having removed a tick from her body aprpoxmiately 10 days ago and then another that had not yet embedded itself a few days ago.  She has been started on doxycyline 153m bid on am of my seeing her in ICU.  Despite bactrim being stopped, doxycyline, carbapenem being given her leukopenia, neutropenia continued to plummet.  ONLY after she was given granix by Dr. GAlvy Bimlerdid Kim Gonzales[place] to improve along with her symptoms.   [facility]  [facility] cause of her ssx. Her RMSF and Ehrlichia were negative though she had tick exposure 10 days prior to hospitalization and it might have been too early for her to mount antibodies.  She was dc on doxycycline which she has completed.  Her CBC with PCP was subsequently normal she tells me though I do not have those labs.  She has been profoundly fatigued and has had dizziness with climbing stairs and after talking for prolonged period of time.  She has had myalgias and joint pains as well. She is not sure if her anxiety about her admission and illness is amplifiyng her ssx.  She also has noted vaginal pruritis and dyspareunia and dysuria which sounds c/w vaginal yeast infection.  Past Medical History  Diagnosis Date  . Anemia     Past Surgical History  Procedure Laterality Date  . Knee arthroscopy    . Cesarean section      x3  . Shoulder arthroscopy    . Wisdom tooth extraction    . Tubal ligation      pptl  . Dilitation & currettage/hystroscopy with hydrothermal ablation N/A 10/10/2015    Procedure: DILATATION & CURETTAGE/HYSTEROSCOPY WITH HYDROTHERMAL ABLATION;  Surgeon: NCrawford Givens MD;  [facility];  Service: Gynecology;  Laterality: N/A;  . Dilation and curettage of uterus      Family History  Problem Relation Age of Onset  . Kidney nephrosis Neg Hx       Social History  Social History  . Marital Status: Married    Spouse Name: N/A  . Number of Children: N/A  . Years of Education: N/A   Social History Main Topics  . Smoking status: Never Smoker   . Smokeless tobacco: None  . Alcohol Use: No  . Drug Use: No  . Sexual Activity: Not Asked   Other Topics Concern  . None   Social History Narrative    Allergies  Allergen Reactions  . Bactrim [Sulfamethoxazole-Trimethoprim] Other (See Comments)    ? Of bactrim induced neutropenia. Not clear this was the cause though.     Current outpatient prescriptions:  .  ferrous sulfate 325 (65 FE) MG  tablet, Take 325 mg by mouth daily with breakfast., Disp: , Rfl:  .  lactobacillus acidophilus (BACID) TABS tablet, Take 2 tablets by mouth 2 (two) times daily., Disp: 30 tablet, Rfl: 0 .  fluconazole (DIFLUCAN) 100 MG tablet, Take 1 tablet (100 mg total) by mouth daily., Disp: 10 tablet, Rfl: 0 .  traMADol (ULTRAM) 50 MG tablet, Take 1 tablet (50 mg total) by mouth every 8 (eight) hours as needed for moderate pain. (Patient not taking: Reported on 03/26/2016), Disp: 30 tablet, Rfl: 0     Review of Systems  Constitutional: Negative for fever, chills, diaphoresis, activity change, appetite change, fatigue and unexpected weight change.  HENT: Negative for congestion, rhinorrhea, sinus pressure, sneezing, sore throat and trouble swallowing.   Eyes: Negative for photophobia and visual disturbance.  Respiratory: Negative for cough, chest tightness, shortness of breath, wheezing and stridor.   Cardiovascular: Negative for chest pain, palpitations and leg swelling.  Gastrointestinal: Negative for nausea, vomiting, abdominal pain, diarrhea, constipation, blood in stool, abdominal distention and anal bleeding.  Genitourinary: Positive for dysuria and pelvic pain. Negative for hematuria, flank pain and difficulty urinating.  Musculoskeletal: Positive for myalgias and arthralgias. Negative for back pain, joint swelling and gait problem.  Skin: Negative for color change, pallor, rash and wound.  Neurological: Positive for light-headedness. Negative for dizziness, tremors and weakness.  Hematological: Negative for adenopathy. Does not bruise/bleed easily.  Psychiatric/Behavioral: Negative for behavioral problems, confusion, sleep disturbance, dysphoric mood, decreased concentration and agitation.       Objective:   Physical Exam  Constitutional: She is oriented to person, place, and time. She appears well-developed and well-nourished. No distress.  HENT:  Head: Normocephalic and atraumatic.  Right Ear:  External ear normal.  Ears:  Mouth/Throat: Uvula is midline, oropharynx is clear and moist and mucous membranes are normal. No oropharyngeal exudate.  Eyes: Conjunctivae and EOM are normal. No scleral icterus.  Neck: Normal range of motion. Neck supple.  Cardiovascular: Normal rate, regular rhythm and normal heart sounds.  Exam reveals no gallop and no friction rub.   No murmur heard. Pulmonary/Chest: Effort normal and breath sounds normal. No respiratory distress. She has no wheezes. She has no rales. She exhibits no tenderness.  Abdominal: Soft. Bowel sounds are normal. She exhibits no distension and no mass. There is no tenderness. There is no rebound and no guarding.  Musculoskeletal: She exhibits no edema or tenderness.  Neurological: She is alert and oriented to person, place, and time. She exhibits normal muscle tone. Coordination normal.  Skin: Skin is warm and dry. No rash noted. She is not diaphoretic. No erythema. No pallor.  Psychiatric: She has a normal mood and affect. Her behavior is normal. Judgment and thought content normal.  Nursing note and vitals reviewed.  Assessment & Plan:   Neutropenic fevers:   It was NEVER clear to me that this was caused by bactrim and it DID NOT improve with stopping the bactrim x 3-4 days. Could it be due to tickborne infection possibly but antibodies were negative 10 days after exposure. I have anxiety she may have a primary bone marrow process driving her pathology  --recheck her CBC c diff, CMP, check ehrlichia and RMSF titers, CK, ESR, CRP  IF her CBC shows return of neutropenia refer to Dr. Alvy Bimler and for bone marrow biopsy  Possible RMSF ehrlichia: as above finished treatment and will check convalescent titers  Vaginal pruritis, dyspareunia = yeast infection will give 10 days of fluconzole  LH with climbing stairs talking = deconditioning +/- anxiety  Sore throat and ear fullness: possible she could have a viral  infection no evidence for bacterial infection  Myalgias: check CPK  I spent greater than 40 minutes with the patient including greater than 50% of time in face to face counsel of the patient re her neutropenic fevers, sepsis, possible tick borne infection, sore throat, ear fullness, LH, vaginal pruritis and in coordination of her care.

## 2016-04-02 LAB — EHRLICHIA ANTIBODY PANEL: E chaffeensis (HGE) Ab, IgG: <1:64 {titer}

## 2016-04-20 ENCOUNTER — Telehealth: Payer: Self-pay | Admitting: *Deleted

## 2016-04-20 NOTE — Telephone Encounter (Signed)
Patient called and asked if [name] would want to see the urine results from a recent test that she had. There was not a good connection on the phone call, but I believe she said that her ecoli was back. She has an upcoming appt with [name] on 05/20/16. Advised her to fax the results to this clinic.

## 2016-04-20 NOTE — Telephone Encounter (Addendum)
Patient's culture and sensitivity received, E. coli with mixed urogenital flora. Patient symptomatic per offic enotes.  Resistant to augmentin, ampicillin, cefazolin, cefuroxime, cephalothin,piperacillin, tetracycline.  Sensitive to cefepime, ceftriaxone, cipro, ertapenem, gentamicin, imipenem, levofloxacin, nitrofurantoin, tobramycin, bactrim.  Dr. Leola Brazil called in bactrim DS on 6/26, but patient is concerned about neutropenia as a side effect of this medication and will not take it without Dr. Lucianne Lei Dam's advice.  Patient's records in [facility] state bactrim allergy.  Patient records from [facility] state Cipro allergy. Please advise.  Landis Gandy, RN

## 2016-04-20 NOTE — Telephone Encounter (Signed)
I would NOT use bactrim that would be dangerous if that drug caused her neutropenia (not clear it did though) I would give her a course of macrobid if she is symptomatic (pain with urination suprapubic pain, fevers)

## 2016-04-21 NOTE — Telephone Encounter (Signed)
Relayed recommendations to Maralyn Sago, RN at [facility].  She will advise the physician on site.  Patient's WBC back today, results = 8.4. Thanks!

## 2016-04-23 NOTE — Telephone Encounter (Signed)
Thank you :)

## 2016-05-05 ENCOUNTER — Encounter: Payer: Self-pay | Admitting: Infectious Disease

## 2016-05-20 ENCOUNTER — Ambulatory Visit (INDEPENDENT_AMBULATORY_CARE_PROVIDER_SITE_OTHER): Payer: BLUE CROSS/BLUE SHIELD | Admitting: Infectious Disease

## 2016-05-20 ENCOUNTER — Encounter: Payer: Self-pay | Admitting: Infectious Disease

## 2016-05-20 VITALS — BP 114/73 | HR 63 | Temp 98.0°F | Ht 67.0 in | Wt 166.5 lb

## 2016-05-20 DIAGNOSIS — Z2239 Carrier of other specified bacterial diseases: Secondary | ICD-10-CM

## 2016-05-20 DIAGNOSIS — D702 Other drug-induced agranulocytosis: Secondary | ICD-10-CM | POA: Diagnosis not present

## 2016-05-20 DIAGNOSIS — D709 Neutropenia, unspecified: Secondary | ICD-10-CM | POA: Diagnosis not present

## 2016-05-20 DIAGNOSIS — R509 Fever, unspecified: Secondary | ICD-10-CM

## 2016-05-20 DIAGNOSIS — Z22358 Carrier of other enterobacterales: Secondary | ICD-10-CM | POA: Insufficient documentation

## 2016-05-20 DIAGNOSIS — N12 Tubulo-interstitial nephritis, not specified as acute or chronic: Secondary | ICD-10-CM

## 2016-05-20 DIAGNOSIS — D72819 Decreased white blood cell count, unspecified: Secondary | ICD-10-CM

## 2016-05-20 DIAGNOSIS — R5081 Fever presenting with conditions classified elsewhere: Secondary | ICD-10-CM

## 2016-05-20 HISTORY — DX: Carrier of other specified bacterial diseases: Z22.39

## 2016-05-20 HISTORY — DX: Carrier of other enterobacterales: Z22.358

## 2016-05-20 NOTE — Progress Notes (Signed)
Chief complaint: Status post recent treatment for possible urinary tract infection which Bactrim was attempted to be given again but she refused appropriately so.    Subjective:    Patient ID: Kim Gonzales, female    DOB: 10-06-1971, 45 y.o.   MRN: [id number]  HPI   45 y.o. female who was admitted to [facility] on 03/03/16.  4 weeks prior to admission she had  developed dysuria, urinary frequency and hesitancy. She did not seek medical evaluation until approximately 10 days prior to her admission. At that time, the patient was started on empiric therapy with Keflex for suspected UTI. Culture was obtained and grew 10-25K Escherichia coli, which looks to be AN ESBL carbapenems, to Bactrim, fluoroquinolones, and Macrodantin. She was rx cipro but became flushed and thought she was having a reaction to this antibiotic and she was changed to oral bactrim which she took for 3 days. She then began having worsening of back and flank pain she had previously and now with intense chills and fevers. On arrival to ED she was noted to profoundly neutropenic and leukopenic. She also in the interim has been developing [place].  She has a mild elevation of AST but normal ALT. She had minimal hyponatremia.  CXR negative. CT renal stone sudy did not show stones or cause for infection. UA with 0-5 WBC, 6-30 RBC  She has been admitted and transferred to stepdown unit. She was started on imipenem and levaquin but has had worsening of her leukopenia and TTPenia.  She also relates having removed a tick from her body aprpoxmiately 10 days ago and then another that had not yet embedded itself a few days ago.  She has been started on doxycyline 100mg  bid on am of my seeing her in ICU.  Despite bactrim being stopped, doxycyline, carbapenem being given her leukopenia, neutropenia continued to plummet.  ONLY after she was given granix by Dr. Alvy Bimler did Kim Gonzales's Kim Gonzales begin to improve along with her symptoms.   All of her  blood work at [facility] was unrevealing for an ID cause of her ssx. Her RMSF and Ehrlichia were negative though she had tick exposure 10 days prior to hospitalization and it might have been too early for her to mount antibodies.  She was dc on doxycycline which she has completed.  Her CBC with PCP was subsequently normal she tells me though I do not have those labs.  She had been profoundly fatigued and has had dizziness with climbing stairs and after talking for prolonged period of time.  She has had myalgias and joint pains as well. She is not sure if her anxiety about her admission and illness is amplifiyng her ssx.  She also has noted vaginal pruritis and dyspareunia and dysuria which sounds c/w vaginal yeast infection while I saw her.  Since then she has been seen by her primary care doctor and she was suffering from dysuria urinalysis was performed and urine culture obtained although the colony counts from my recollection were not extremely high. Attempts were made to treat her with Bactrim again but she purply declined that given the history of likely Bactrim induced neutropenia. I recommended a different antibiotic which resolved her symptoms she has seen urology who performed cystoscopy and not found any abnormalities.  She says she's may begin to develop another yeast infection. She states her CBC checked with primary care was normal as well.  Upon reviewing her records with her from her memory sheet states that her CBC was  not checked just prior to her developing the neutropenia and otherwise just prior to her being diagnosed with a "UTI". She's been doing relatively well though I saw some concerns there could be something going on such as cyclic neutropenia.  Past Medical History:  Diagnosis Date  . Anemia   . Dyspareunia in female 03/26/2016  . Lightheadedness 03/26/2016  . Myalgia 03/26/2016  . Vaginal pruritus 03/26/2016    Past Surgical History:  Procedure Laterality Date  . CESAREAN  SECTION     x3  . DILATION AND CURETTAGE OF UTERUS    . DILITATION & CURRETTAGE/HYSTROSCOPY WITH HYDROTHERMAL ABLATION N/A 10/10/2015   Procedure: DILATATION & CURETTAGE/HYSTEROSCOPY WITH HYDROTHERMAL ABLATION;  Surgeon: Crawford Givens, MD;  Location: [facility];  Service: Gynecology;  Laterality: N/A;  . KNEE ARTHROSCOPY    . SHOULDER ARTHROSCOPY    . TUBAL LIGATION     pptl  . WISDOM TOOTH EXTRACTION      Family History  Problem Relation Age of Onset  . Kidney nephrosis Neg Hx       Social History   Social History  . Marital status: Married    Spouse name: N/A  . Number of children: N/A  . Years of education: N/A   Social History Main Topics  . Smoking status: Never Smoker  . Smokeless tobacco: Never Used  . Alcohol use No  . Drug use: No  . Sexual activity: Not Asked   Other Topics Concern  . None   Social History Narrative  . None    Allergies  Allergen Reactions  . Bactrim [Sulfamethoxazole-Trimethoprim] Other (See Comments)    ? Of bactrim induced neutropenia. Not clear this was the cause though.  . Ciprofloxacin Other (See Comments)    Facial loss of freckles.     Current Outpatient Prescriptions:  .  ferrous sulfate 325 (65 FE) MG tablet, Take 325 mg by mouth daily with breakfast., Disp: , Rfl:      Review of Systems  Constitutional: Negative for activity change, appetite change, chills, diaphoresis, fatigue, fever and unexpected weight change.  HENT: Negative for congestion, rhinorrhea, sinus pressure, sneezing, sore throat and trouble swallowing.   Eyes: Negative for photophobia and visual disturbance.  Respiratory: Negative for cough, chest tightness, shortness of breath, wheezing and stridor.   Cardiovascular: Negative for chest pain, palpitations and leg swelling.  Gastrointestinal: Negative for abdominal distention, abdominal pain, anal bleeding, blood in stool, constipation, diarrhea, nausea and vomiting.  Genitourinary: Negative for difficulty  urinating, flank pain and hematuria.  Musculoskeletal: Negative for arthralgias, back pain, gait problem, joint swelling and myalgias.  Skin: Negative for color change, pallor, rash and wound.  Neurological: Negative for dizziness, tremors, weakness and light-headedness.  Hematological: Negative for adenopathy. Does not bruise/bleed easily.  Psychiatric/Behavioral: Negative for agitation, behavioral problems, confusion, decreased concentration, dysphoric mood and sleep disturbance.       Objective:   Physical Exam  Constitutional: She is oriented to person, place, and time. She appears well-developed and well-nourished. No distress.  HENT:  Head: Normocephalic and atraumatic.  Right Ear: External ear normal.  Mouth/Throat: Uvula is midline, oropharynx is clear and moist and mucous membranes are normal. No oropharyngeal exudate.  Eyes: Conjunctivae and EOM are normal. No scleral icterus.  Neck: Normal range of motion. Neck supple.  Cardiovascular: Normal rate, regular rhythm and normal heart sounds.  Exam reveals no gallop and no friction rub.   No murmur heard. Pulmonary/Chest: Effort normal and breath sounds normal. No respiratory distress.  She has no wheezes. She has no rales. She exhibits no tenderness.  Abdominal: Soft. Bowel sounds are normal. She exhibits no distension and no mass. There is no tenderness. There is no rebound and no guarding.  Musculoskeletal: She exhibits no edema or tenderness.  Neurological: She is alert and oriented to person, place, and time. She exhibits normal muscle tone. Coordination normal.  Skin: Skin is warm and dry. No rash noted. She is not diaphoretic. No erythema. No pallor.  Psychiatric: She has a normal mood and affect. Her behavior is normal. Judgment and thought content normal.  Nursing note and vitals reviewed.         Assessment & Plan:   Neutropenic fevers:   Potentially due to bactrim though I have concern for cyclic neutropenia as  well  Her acute and acute and combo lessen titers for ehrlichia and RMSF were negative    Vaginal pruritis, dyspareunia = may need another treatment for yeast infection L  Colonization with ESBL: Would recommend avoiding antibiotics if possible to not select for this highly resistant micro-organisms   I spent greater than 25 minutes with the patient including greater than 50% of time in face to face counsel of the patient re her neutropenic fevers, sepsis, ESBL colonization vaginal pruritis and in coordination of her care.

## 2017-01-12 IMAGING — CT CT ABD-PELV W/ CM
3 of 5 series · 17 of 46 positions shown, 19 images · IV contrast (iopamidol)
Comparison: None.

CLINICAL DATA: Febrile neutropenia.

EXAM:
CT CHEST, ABDOMEN, AND PELVIS WITH CONTRAST
TECHNIQUE: Multidetector CT imaging of the chest, abdomen and pelvis was
performed following the standard protocol during bolus
administration of intravenous contrast.
CONTRAST:  100mL [id number] IOPAMIDOL ([id number]) INJECTION 61%

[Series 2: cap with st · axial · 0.76mm/px · z∈[-613,-73]mm · 11 of 130 slices shown, 13 images]
[im 11/130  soft-tissue]
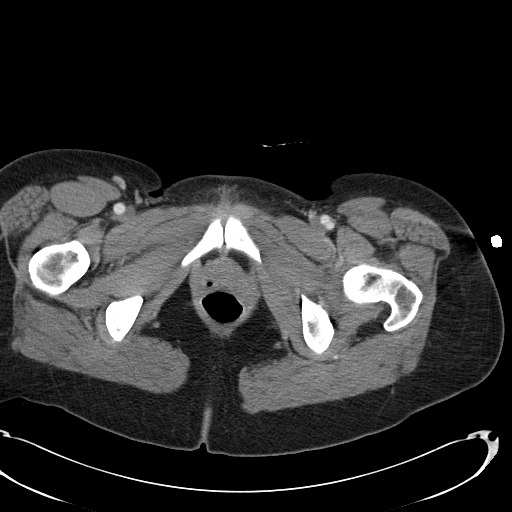
[im 11/130  bone]
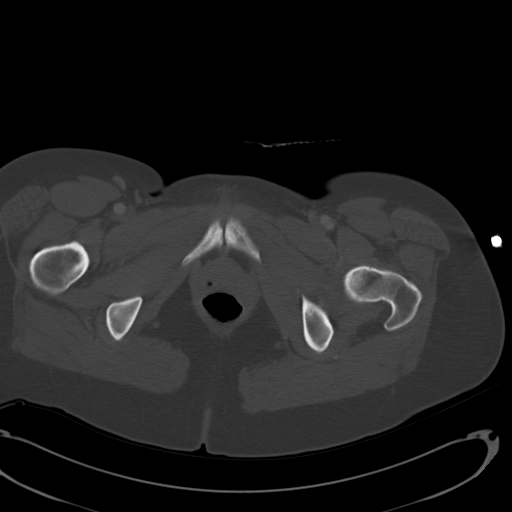
[im 22/130  soft-tissue]
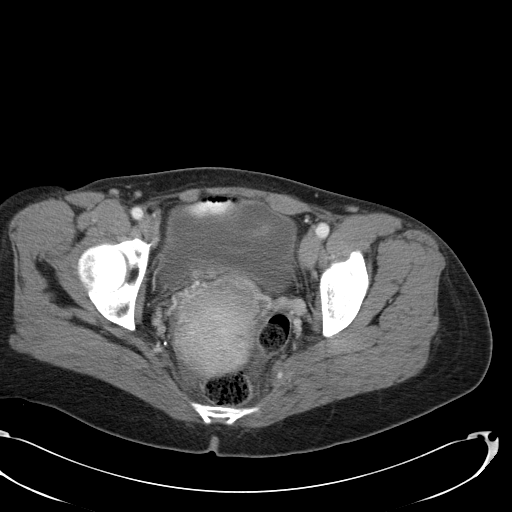
[im 33/130  soft-tissue]
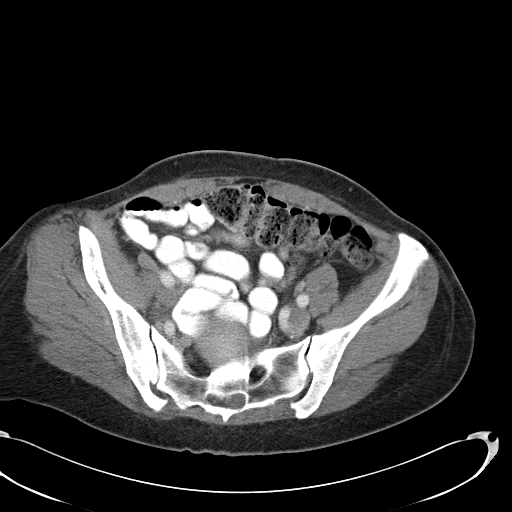
[im 44/130  soft-tissue]
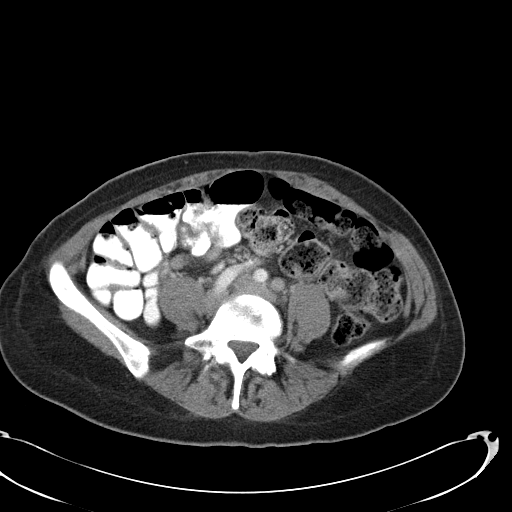
[im 54/130  soft-tissue]
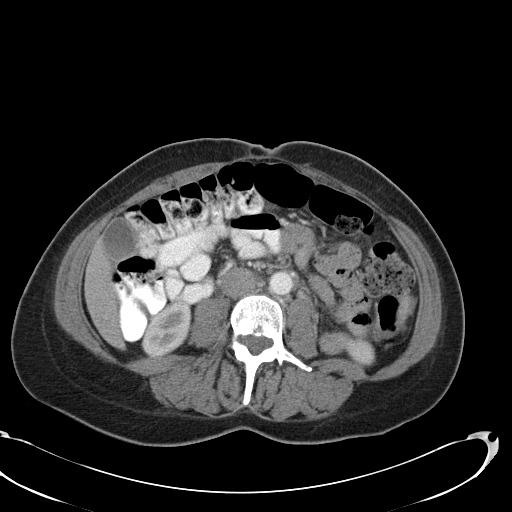
[im 65/130  soft-tissue]
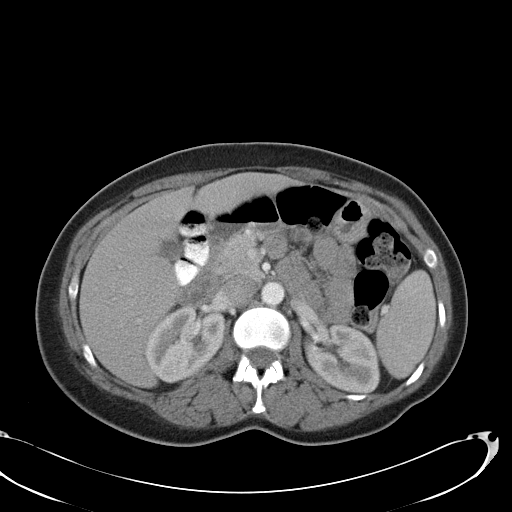
[im 76/130  soft-tissue]
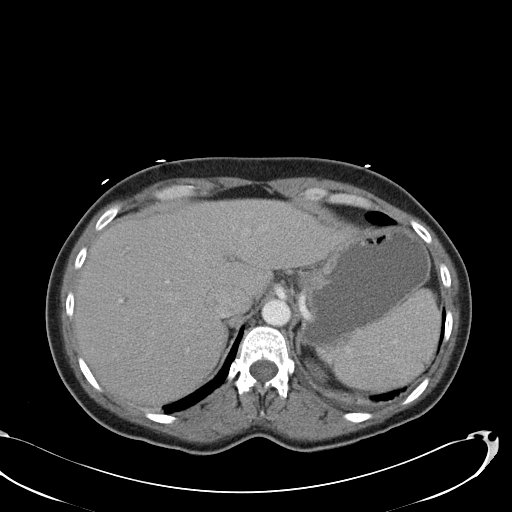
[im 87/130  soft-tissue]
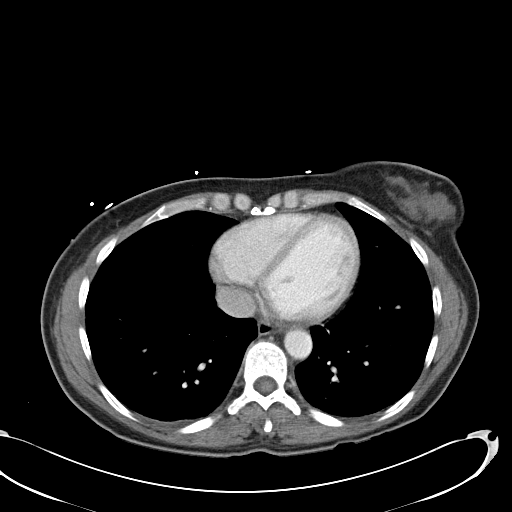
[im 97/130  soft-tissue]
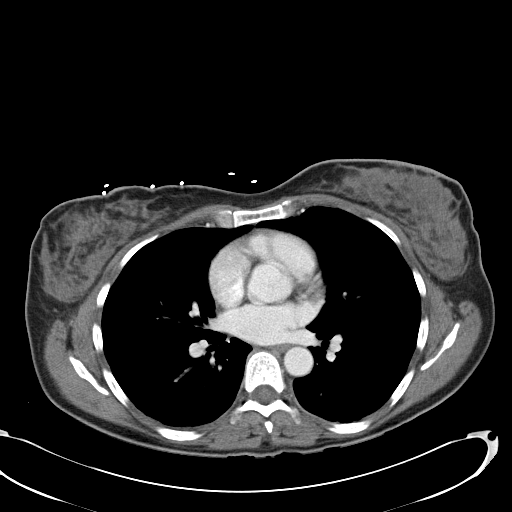
[im 97/130  bone]
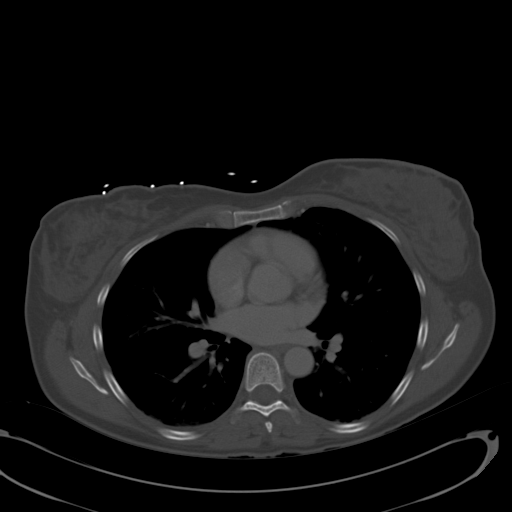
[im 108/130  soft-tissue]
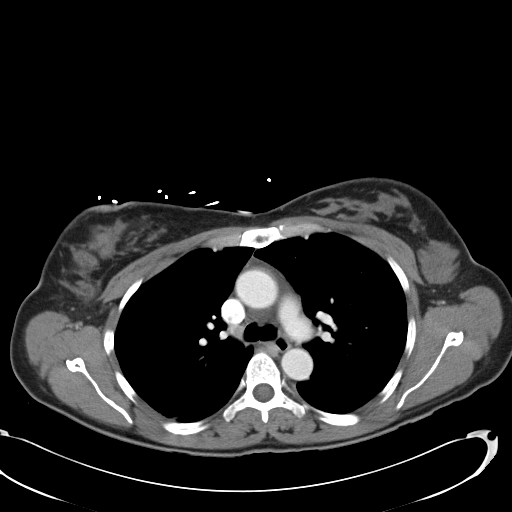
[im 119/130  soft-tissue]
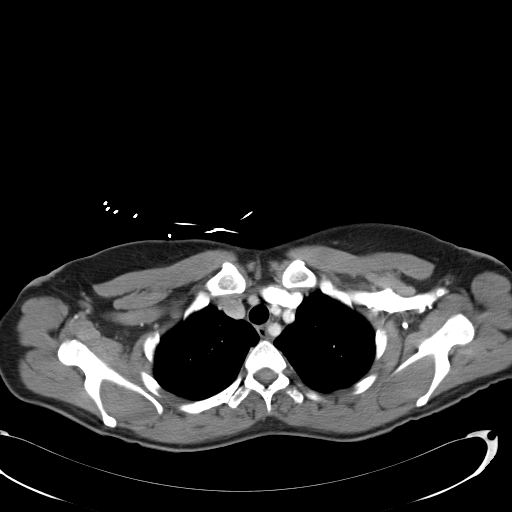

[Series 4: lung windows · axial · 0.76mm/px · z∈[-303,-223]mm · 3 of 149 slices shown]
[im 10/149  bone]
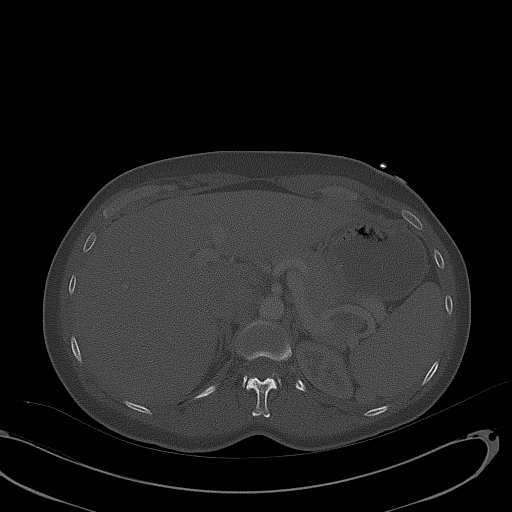
[im 30/149  bone]
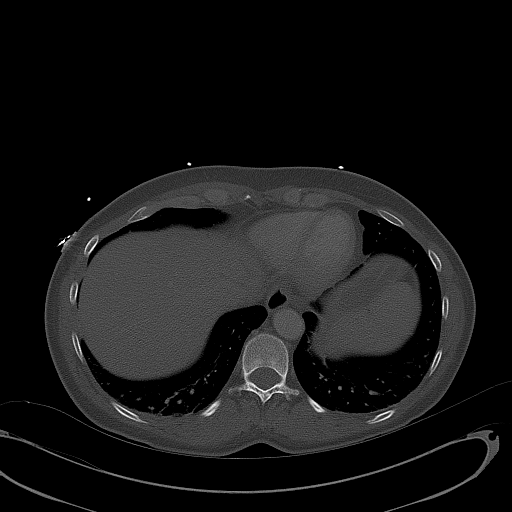
[im 50/149  bone]
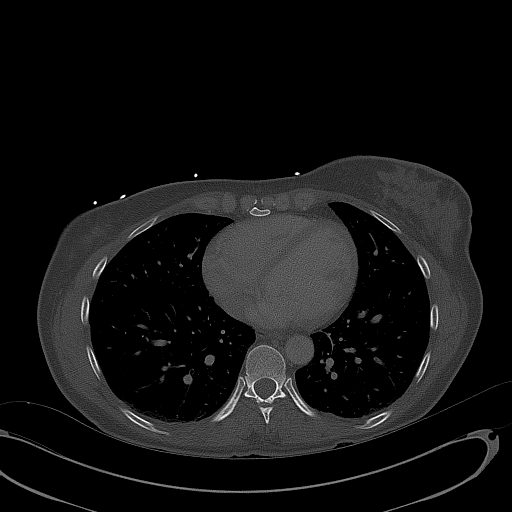

[Series 602: coronal · coronal · 1.27mm/px · 3 of 118 slices shown]
[im 40/118  soft-tissue]
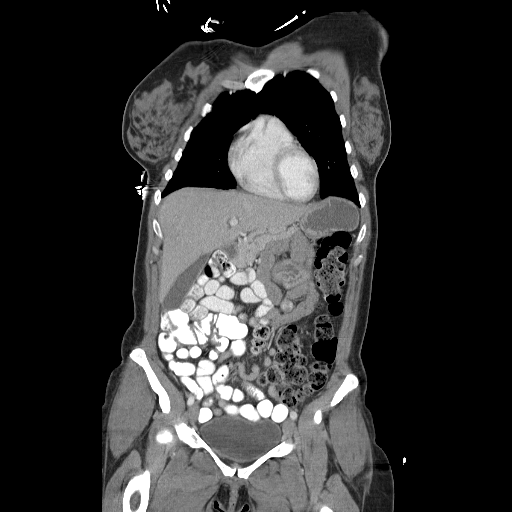
[im 53/118  soft-tissue]
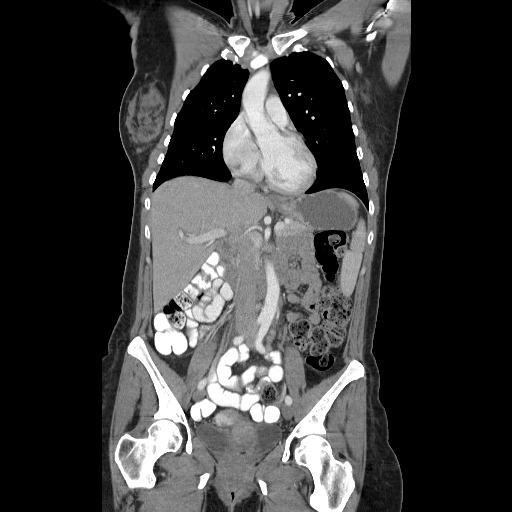
[im 66/118  soft-tissue]
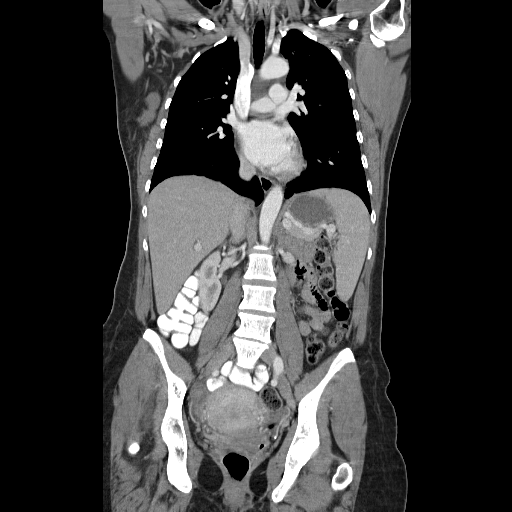

[17 of 46 positions shown; findings below may reference images not displayed]

FINDINGS: CT CHEST

THORACIC INLET/BODY WALL:

No acute abnormality.

MEDIASTINUM:

Normal heart size. No pericardial effusion. No acute vascular
abnormality. Incidental aberrant right subclavian artery. No
adenopathy.

LUNG WINDOWS:

No consolidation.  No effusion.  No suspicious pulmonary nodule.

UPPER ABDOMEN:

No acute findings.

OSSEOUS:

No acute fracture. No suspicious lytic or blastic lesions. Right
rotator cuff repair.

CT ABDOMEN AND PELVIS

Aabdominal wall: Small fatty umbilical hernia. Abdominal wall
subcutaneous gas from medication injection

Hepatobiliary: No focal liver abnormality.No evidence of biliary
obstruction or stone.

Pancreas: Unremarkable.

Spleen: Unremarkable.

Adrenals/Urinary Tract: Negative adrenals. No hydronephrosis or
stone. Unremarkable bladder.

Reproductive:Globular appearance of the uterus with indistinct
endometrium and probable tiny cyst. Dimensions are similar to pelvic
ultrasound 12/28/2014 when the endometrium had a normal appearance.
There is a sub serosal an exophytic fibroid from the lower uterine
segment measuring 21 mm.

Stomach/Bowel:  No obstruction. No appendicitis.

Vascular/Lymphatic: No acute vascular abnormality. No mass or
adenopathy.

Peritoneal: No ascites or pneumoperitoneum.

Musculoskeletal: No contributory finding.
IMPRESSION: 1. No explanation for fever. No signs of infection and no
adenopathy.
2. Globular enlargement of the uterus similar to 12/28/2014 pelvic
ultrasound and suggesting adenomyosis. Exophytic uterine fibroid.

## 2018-03-28 DIAGNOSIS — R635 Abnormal weight gain: Secondary | ICD-10-CM | POA: Diagnosis not present

## 2018-03-28 DIAGNOSIS — N951 Menopausal and female climacteric states: Secondary | ICD-10-CM | POA: Diagnosis not present

## 2018-03-30 DIAGNOSIS — E7800 Pure hypercholesterolemia, unspecified: Secondary | ICD-10-CM | POA: Diagnosis not present

## 2018-03-30 DIAGNOSIS — N951 Menopausal and female climacteric states: Secondary | ICD-10-CM | POA: Diagnosis not present

## 2018-03-30 DIAGNOSIS — Z1331 Encounter for screening for depression: Secondary | ICD-10-CM | POA: Diagnosis not present

## 2018-03-30 DIAGNOSIS — Z1339 Encounter for screening examination for other mental health and behavioral disorders: Secondary | ICD-10-CM | POA: Diagnosis not present

## 2018-03-30 DIAGNOSIS — R232 Flushing: Secondary | ICD-10-CM | POA: Diagnosis not present

## 2018-12-01 ENCOUNTER — Other Ambulatory Visit: Payer: Self-pay | Admitting: Family Medicine

## 2018-12-01 DIAGNOSIS — Z1231 Encounter for screening mammogram for malignant neoplasm of breast: Secondary | ICD-10-CM

## 2018-12-02 DIAGNOSIS — K5900 Constipation, unspecified: Secondary | ICD-10-CM | POA: Diagnosis not present

## 2018-12-02 DIAGNOSIS — R51 Headache: Secondary | ICD-10-CM | POA: Diagnosis not present

## 2018-12-02 DIAGNOSIS — D509 Iron deficiency anemia, unspecified: Secondary | ICD-10-CM | POA: Diagnosis not present

## 2018-12-02 DIAGNOSIS — R5383 Other fatigue: Secondary | ICD-10-CM | POA: Diagnosis not present

## 2018-12-22 DIAGNOSIS — Z01419 Encounter for gynecological examination (general) (routine) without abnormal findings: Secondary | ICD-10-CM | POA: Diagnosis not present

## 2018-12-22 DIAGNOSIS — Z6825 Body mass index (BMI) 25.0-25.9, adult: Secondary | ICD-10-CM | POA: Diagnosis not present

## 2018-12-22 DIAGNOSIS — Z124 Encounter for screening for malignant neoplasm of cervix: Secondary | ICD-10-CM | POA: Diagnosis not present

## 2018-12-22 DIAGNOSIS — R87610 Atypical squamous cells of undetermined significance on cytologic smear of cervix (ASC-US): Secondary | ICD-10-CM | POA: Diagnosis not present

## 2018-12-22 DIAGNOSIS — Z1231 Encounter for screening mammogram for malignant neoplasm of breast: Secondary | ICD-10-CM | POA: Diagnosis not present

## 2019-01-10 DIAGNOSIS — R6889 Other general symptoms and signs: Secondary | ICD-10-CM | POA: Diagnosis not present

## 2019-02-02 DIAGNOSIS — R5383 Other fatigue: Secondary | ICD-10-CM | POA: Diagnosis not present

## 2019-02-02 DIAGNOSIS — R6889 Other general symptoms and signs: Secondary | ICD-10-CM | POA: Diagnosis not present

## 2019-02-02 DIAGNOSIS — R079 Chest pain, unspecified: Secondary | ICD-10-CM | POA: Diagnosis not present

## 2019-02-02 DIAGNOSIS — R05 Cough: Secondary | ICD-10-CM | POA: Diagnosis not present

## 2019-06-22 DIAGNOSIS — B839 Helminthiasis, unspecified: Secondary | ICD-10-CM | POA: Diagnosis not present

## 2019-06-22 DIAGNOSIS — L989 Disorder of the skin and subcutaneous tissue, unspecified: Secondary | ICD-10-CM | POA: Diagnosis not present

## 2019-06-26 DIAGNOSIS — B839 Helminthiasis, unspecified: Secondary | ICD-10-CM | POA: Diagnosis not present

## 2019-07-10 DIAGNOSIS — R102 Pelvic and perineal pain: Secondary | ICD-10-CM | POA: Diagnosis not present

## 2019-07-10 DIAGNOSIS — N939 Abnormal uterine and vaginal bleeding, unspecified: Secondary | ICD-10-CM | POA: Diagnosis not present

## 2019-07-10 DIAGNOSIS — N920 Excessive and frequent menstruation with regular cycle: Secondary | ICD-10-CM | POA: Diagnosis not present

## 2019-08-11 DIAGNOSIS — B839 Helminthiasis, unspecified: Secondary | ICD-10-CM | POA: Diagnosis not present

## 2019-08-14 ENCOUNTER — Other Ambulatory Visit: Payer: Self-pay | Admitting: Obstetrics and Gynecology

## 2019-09-13 DIAGNOSIS — R102 Pelvic and perineal pain: Secondary | ICD-10-CM | POA: Diagnosis present

## 2019-09-13 DIAGNOSIS — N920 Excessive and frequent menstruation with regular cycle: Secondary | ICD-10-CM | POA: Diagnosis present

## 2019-09-13 DIAGNOSIS — D219 Benign neoplasm of connective and other soft tissue, unspecified: Secondary | ICD-10-CM | POA: Diagnosis present

## 2019-09-13 NOTE — H&P (Signed)
Kim Gonzales is a 48 y.o.  female, P: 3-0-0-3, who  presents for hysterectomy because of menorrhagia and pelvic pain.  The patient has a history of heavy menses and underwent an endometrial ablation in 2016.  Over  the past year however,  the patient's  menstrual flow volume has worsened  accompanied by  severe cramping with each menstrual cycle. She finds herself barely able to function during these times and often misses work. Her 5 day flow requires the change of a pad every 1-1.5 hours and cramping is rated as a 9.5/10. She only finds relief from her cramping with Ibuprofen 800 mg followed by Tylenol 1000 mg, 2 hours later. She goes on to report menstrual headaches, urinary incontinence and sluggish bowel function but no dyspareunia or vaginitis symptoms. She has also noticed that she will have a monthly right sided pelvic pain that will last for several day then it will go away. She has attributed this, however to ovulation.  A pelvic ultrasound in September 2020 showed a retroverted uterus: 10.2 cm from fundus to external os: 7.28 x 7.72 x 7.54 cm, endometrium: 4.56 mm;  #4 fibroids: right LUS pedunculated-2.78 cm, right LUS intramural-2.76 cm, left mid-intramural-2.63 cm and anterior mid-intramural-1.80 cm; right ovary-2.94 cm and left ovary-3.34 cm.   An endometrial biopsy at that same  time did not reveal any hyperplasia or malignancy.  The patient was given a review of both medical and surgical management options for her symptoms, however she has decided to proceed with definitive therapy in the form of hysterectomy.  Past Medical History  OB History: G:3;  P: 3-0-0-3: C-secitons: 1999, 2001 and 2005  GYN History: menarche: 48 YO;    LMP: 09/01/2019;    Contraception: Tubal Sterilization;    Denies history of abnormal PAP smear.   Last PAP smear: 2020 was ASCUS with negative HPV  Medical History: Menstrual Headaches  Surgical History: 1977 Right Wrist Fracture Surgery; 1989 Right Shoulder Surgery;  2002 Right Knee Surgery, 2005 Tubal Sterilization;  Left Knee Surgery 2013 and  2016 Endometrial Ablation Denies problems with anesthesia  (except with her C-section the regional anesthesia did not work)   or history of blood transfusions  Family History: Cancer, Heart Disease and Diabetes Mellitus  Social History: Married and employed as an Ecologist;  She denies tobacco or alcohol use.   Medication: Ibuprofen 800 mg  po pc every 8 hours prn Extra Strength Tylenol  #2 po every 6 hours prn Multivitamins po daily  Allergies  Allergen Reactions  . Bactrim [Sulfamethoxazole-Trimethoprim] Other (See Comments)    ? Of bactrim induced neutropenia. Not clear this was the cause though.  . Ciprofloxacin Other (See Comments)    Facial loss of freckles.    Denies sensitivity to peanuts, shellfish, latex or adhesives.  Soy products cause headaches.   ROS: Admits to glasses, sluggish bowel function, occasional right knee swelling, menstrual headaches and incontinence but  denies  vision changes, nasal congestion, dysphagia, tinnitus, dizziness, hoarseness, cough,  chest pain, shortness of breath, nausea, vomiting, diarrhea, urinary frequency, urgency  dysuria, hematuria, vaginitis symptoms, easy bruising,  myalgias, arthralgias, skin rashes, unexplained weight loss and except as is mentioned in the history of present illness, patient's review of systems is otherwise negative.     Physical Exam  Bp: 102/60;  P: 76 bpm;   R: 16;   Temperature: 99.1 degrees F orally;  Weight: 180 lbs.  Height: 5'7"  BMI: 27.8  Neck: supple without masses  or thyromegaly Lungs: clear to auscultation Heart: regular rate and rhythm Abdomen: soft, non-tender and no organomegaly Pelvic:EGBUS- wnl; vagina-normal rugae; uterus-ULNS, irregular, cervix without lesions or motion tenderness; adnexae-no tenderness or masses Extremities:  no clubbing, cyanosis or edema   Assesment: Menorrhagia                       Uterine Fibroids                      Pelvic Pain   Disposition:  A discussion was held with patient regarding the indication for her procedure(s) along with the risks, which include but are not limited to: reaction to anesthesia, damage to adjacent organs, infection, excessive bleeding and the possible need for an open abdominal incision. The patient verbalized understanding of these risks and has consented to proceed with a Laparoscopically Assisted Vaginal Hysterectomy with Bilateral Salpingectomy and Possible Total Abdominal Hysterectomy at [facility] on September 27, 2019 @ 1 p.m.  CSN# [id number]   Daje Stark J. Florene Glen, PA-C  for Dr. Franklyn Lor. Dillard

## 2019-09-15 NOTE — Pre-Procedure Instructions (Signed)
[id number] - [place], [place] - [address] [place] [address] Phone: [phone] Fax: [phone]      Your procedure is scheduled on Wednesday, December 2, from 09:00 AM to 11:35 AM.  Report to [facility]tr[facility] "A" at 07:00 A.M., and check in at the Admitting office.  Call this number if you have problems the morning of surgery:  [phone]  Call [phone] if you have any questions prior to your surgery date Monday-Friday 8am-4pm    Remember:  Do not eat or drink after midnight the night before your surgery   Take these medicines the morning of surgery with A SIP OF WATER:  acetaminophen (TYLENOL)- if needed  7 days prior to surgery STOP taking any Aspirin (unless otherwise instructed by your surgeon), Aleve, Naproxen, Ibuprofen, Motrin, Advil, Goody's, BC's, all herbal medications, fish oil, and all vitamins.    The Morning of Surgery  Do not wear jewelry, make-up or nail polish.  Do not wear lotions, powders, or perfumes, or deodorant  Do not shave 48 hours prior to surgery.    Do not bring valuables to the hospital.  [facility] is not responsible for any belongings or valuables.  If you are a smoker, DO NOT Smoke 24 hours prior to surgery  If you wear a CPAP at night please bring your mask, tubing, and machine the morning of surgery   Remember that you must have someone to transport you home after your surgery, and remain with you for 24 hours if you are discharged the same day.   Please bring cases for contacts, glasses, hearing aids, dentures or bridgework because it cannot be worn into surgery.    Leave your suitcase in the car.  After surgery it may be brought to your room.  For patients admitted to the hospital, discharge time will be determined by your treatment team.  Patients discharged the day of surgery will not be allowed to drive home.    Special instructions:   [place]- Preparing For Surgery  Before  surgery, you can play an important role. Because skin is not sterile, your skin needs to be as free of germs as possible. You can reduce the number of germs on your skin by washing with CHG (chlorahexidine gluconate) Soap before surgery.  CHG is an antiseptic cleaner which kills germs and bonds with the skin to continue killing germs even after washing.    Oral Hygiene is also important to reduce your risk of infection.  Remember - BRUSH YOUR TEETH THE MORNING OF SURGERY WITH YOUR REGULAR TOOTHPASTE  Please do not use if you have an allergy to CHG or antibacterial soaps. If your skin becomes reddened/irritated stop using the CHG.  Do not shave (including legs and underarms) for at least 48 hours prior to first CHG shower. It is OK to shave your face.  Please follow these instructions carefully.   1. Shower the NIGHT BEFORE SURGERY and the MORNING OF SURGERY with CHG Soap.   2. If you chose to wash your hair, wash your hair first as usual with your normal shampoo.  3. After you shampoo, rinse your hair and body thoroughly to remove the shampoo.  4. Use CHG as you would any other liquid soap. You can apply CHG directly to the skin and wash gently with a scrungie or a clean washcloth.   5. Apply the CHG Soap to your body ONLY FROM THE NECK DOWN.  Do not use on  open wounds or open sores. Avoid contact with your eyes, ears, mouth and genitals (private parts). Wash Face and genitals (private parts)  with your normal soap.   6. Wash thoroughly, paying special attention to the area where your surgery will be performed.  7. Thoroughly rinse your body with warm water from the neck down.  8. DO NOT shower/wash with your normal soap after using and rinsing off the CHG Soap.  9. Pat yourself dry with a CLEAN TOWEL.  10. Wear CLEAN PAJAMAS to bed the night before surgery, wear comfortable clothes the morning of surgery  11. Place CLEAN SHEETS on your bed the night of your first shower and DO NOT SLEEP  WITH PETS.    Day of Surgery:  Please shower the morning of surgery with the CHG soap Do not apply any deodorants/lotions. Please wear clean clothes to the hospital/surgery center.   Remember to brush your teeth WITH YOUR REGULAR TOOTHPASTE.   Please read over the following fact sheets that you were given.

## 2019-09-18 ENCOUNTER — Encounter (HOSPITAL_COMMUNITY): Payer: Self-pay

## 2019-09-18 ENCOUNTER — Encounter (HOSPITAL_COMMUNITY)
Admission: RE | Admit: 2019-09-18 | Discharge: 2019-09-18 | Disposition: A | Discharge status: Home / Self Care | Payer: BC Managed Care – PPO | Source: Ambulatory Visit | Attending: Obstetrics and Gynecology | Admitting: Obstetrics and Gynecology

## 2019-09-18 ENCOUNTER — Other Ambulatory Visit: Payer: Self-pay

## 2019-09-18 DIAGNOSIS — Z01812 Encounter for preprocedural laboratory examination: Secondary | ICD-10-CM | POA: Diagnosis not present

## 2019-09-18 HISTORY — DX: Depression, unspecified: F32.A

## 2019-09-18 LAB — CBC
HCT: 40.9 % (ref 36.0–46.0)
Hemoglobin: 13.3 g/dL (ref 12.0–15.0)
MCH: 29 pg (ref 26.0–34.0)
MCHC: 32.5 g/dL (ref 30.0–36.0)
MCV: 89.1 fL (ref 80.0–100.0)
Platelets: 265 10*3/uL (ref 150–400)
RBC: 4.59 MIL/uL (ref 3.87–5.11)
RDW: 13 % (ref 11.5–15.5)
WBC: 5.3 10*3/uL (ref 4.0–10.5)
nRBC: 0 % (ref 0.0–0.2)

## 2019-09-18 LAB — BASIC METABOLIC PANEL
Anion gap: 7 (ref 5–15)
BUN: 10 mg/dL (ref 6–20)
CO2: 26 mmol/L (ref 22–32)
Calcium: 9.5 mg/dL (ref 8.9–10.3)
Chloride: 107 mmol/L (ref 98–111)
Creatinine, Ser: 0.88 mg/dL (ref 0.44–1.00)
GFR calc Af Amer: >60 mL/min (ref >60)
GFR calc non Af Amer: >60 mL/min (ref >60)
Glucose, Bld: 100 mg/dL — ABNORMAL HIGH (ref 70–99)
Potassium: 4.7 mmol/L (ref 3.5–5.1)
Sodium: 140 mmol/L (ref 135–145)

## 2019-09-18 LAB — TYPE AND SCREEN
ABO/RH(D): A POS
Antibody Screen: NEGATIVE

## 2019-09-18 LAB — ABO/RH: ABO/RH(D): A POS

## 2019-09-18 NOTE — Progress Notes (Signed)
PCP - Dr. Nancy Fetter Cardiologist - denies  Chest x-ray - n/a EKG - n/a  COVID TEST- Monday, Nov. 30th   Anesthesia review: n/a  Patient denies shortness of breath, fever, cough and chest pain at PAT appointment   All instructions explained to the patient, with a verbal understanding of the material. Patient agrees to go over the instructions while at home for a better understanding. Patient also instructed to self quarantine after being tested for COVID-19. The opportunity to ask questions was provided.

## 2019-09-25 ENCOUNTER — Other Ambulatory Visit (HOSPITAL_COMMUNITY)
Admission: RE | Admit: 2019-09-25 | Discharge: 2019-09-25 | Disposition: A | Discharge status: Home / Self Care | Payer: BC Managed Care – PPO | Source: Ambulatory Visit | Attending: Obstetrics and Gynecology | Admitting: Obstetrics and Gynecology

## 2019-09-25 DIAGNOSIS — Z20828 Contact with and (suspected) exposure to other viral communicable diseases: Secondary | ICD-10-CM | POA: Diagnosis not present

## 2019-09-25 LAB — SARS CORONAVIRUS 2 (TAT 6-24 HRS): SARS Coronavirus 2: NEGATIVE

## 2019-09-26 NOTE — Anesthesia Preprocedure Evaluation (Addendum)
Anesthesia Evaluation  Patient identified by MRN, date of birth, ID band Patient awake    Reviewed: Allergy & Precautions, NPO status , Patient's Chart, lab work & pertinent test results  History of Anesthesia Complications Negative for: history of anesthetic complications  Airway Mallampati: I  TM Distance: >3 FB Neck ROM: Full    Dental  (+) Missing,    Pulmonary neg pulmonary ROS,    Pulmonary exam normal        Cardiovascular negative cardio ROS Normal cardiovascular exam     Neuro/Psych negative neurological ROS  negative psych ROS   GI/Hepatic negative GI ROS, Neg liver ROS,   Endo/Other  negative endocrine ROS  Renal/GU negative Renal ROS  negative genitourinary   Musculoskeletal negative musculoskeletal ROS (+)   Abdominal   Peds  Hematology negative hematology ROS (+)   Anesthesia Other Findings Day of surgery medications reviewed with patient.  Reproductive/Obstetrics Uterine fibroids                            Anesthesia Physical Anesthesia Plan  ASA: I  Anesthesia Plan: General   Post-op Pain Management:    Induction: Intravenous  PONV Risk Score and Plan: 4 or greater and Treatment may vary due to age or medical condition, Ondansetron, Dexamethasone, Midazolam and Scopolamine patch - Pre-op  Airway Management Planned: Oral ETT  Additional Equipment: None  Intra-op Plan:   Post-operative Plan: Extubation in OR  Informed Consent: I have reviewed the patients History and Physical, chart, labs and discussed the procedure including the risks, benefits and alternatives for the proposed anesthesia with the patient or authorized representative who has indicated his/her understanding and acceptance.     Dental advisory given  Plan Discussed with: CRNA  Anesthesia Plan Comments:        Anesthesia Quick Evaluation

## 2019-09-26 NOTE — Progress Notes (Addendum)
Spoke w/ via phone for pre-op interview---called Kim Gonzales with surgery location change  Lab needs dos----  Type and screen  (TYPE AND SCREEN DONE AT [facility] DAY MUST BE REPEATED PER [facility])           Lab results------cbc, bmet 09-18-19 chart/epic COVID test ------09-25-2019 Arrive at -------630 am 09-27-2019 NPO after ------midnight Medications to take morning of surgery -----none Diabetic medication -----n/a Patient Special Instructions -----patient given overnight stay instructions Pre-Op special Istructions ----- Patient verbalized understanding of instructions that were given at this phone interview. Patient denies shortness of breath, chest pain, fever, cough a this phone interview.

## 2019-09-27 ENCOUNTER — Ambulatory Visit (HOSPITAL_BASED_OUTPATIENT_CLINIC_OR_DEPARTMENT_OTHER): Payer: BC Managed Care – PPO | Admitting: Anesthesiology

## 2019-09-27 ENCOUNTER — Encounter (HOSPITAL_BASED_OUTPATIENT_CLINIC_OR_DEPARTMENT_OTHER): Payer: Self-pay | Admitting: Anesthesiology

## 2019-09-27 ENCOUNTER — Other Ambulatory Visit: Payer: Self-pay

## 2019-09-27 ENCOUNTER — Inpatient Hospital Stay (HOSPITAL_BASED_OUTPATIENT_CLINIC_OR_DEPARTMENT_OTHER)
Admission: AD | Admit: 2019-09-27 | Discharge: 2019-09-29 | DRG: 743 | Disposition: A | Discharge status: Home / Self Care | Payer: BC Managed Care – PPO | Attending: Obstetrics and Gynecology | Admitting: Obstetrics and Gynecology

## 2019-09-27 ENCOUNTER — Encounter (HOSPITAL_COMMUNITY)
Admission: AD | Disposition: A | Discharge status: Home / Self Care | Payer: Self-pay | Source: Home / Self Care | Attending: Obstetrics and Gynecology

## 2019-09-27 DIAGNOSIS — Z888 Allergy status to other drugs, medicaments and biological substances status: Secondary | ICD-10-CM | POA: Diagnosis not present

## 2019-09-27 DIAGNOSIS — Z5331 Laparoscopic surgical procedure converted to open procedure: Secondary | ICD-10-CM | POA: Diagnosis not present

## 2019-09-27 DIAGNOSIS — N920 Excessive and frequent menstruation with regular cycle: Secondary | ICD-10-CM | POA: Diagnosis present

## 2019-09-27 DIAGNOSIS — R32 Unspecified urinary incontinence: Secondary | ICD-10-CM | POA: Diagnosis present

## 2019-09-27 DIAGNOSIS — Z9851 Tubal ligation status: Secondary | ICD-10-CM | POA: Diagnosis not present

## 2019-09-27 DIAGNOSIS — D259 Leiomyoma of uterus, unspecified: Principal | ICD-10-CM | POA: Diagnosis present

## 2019-09-27 DIAGNOSIS — R102 Pelvic and perineal pain unspecified side: Secondary | ICD-10-CM | POA: Diagnosis present

## 2019-09-27 DIAGNOSIS — N946 Dysmenorrhea, unspecified: Secondary | ICD-10-CM | POA: Diagnosis not present

## 2019-09-27 DIAGNOSIS — A419 Sepsis, unspecified organism: Secondary | ICD-10-CM | POA: Diagnosis not present

## 2019-09-27 DIAGNOSIS — D72819 Decreased white blood cell count, unspecified: Secondary | ICD-10-CM | POA: Diagnosis not present

## 2019-09-27 DIAGNOSIS — D219 Benign neoplasm of connective and other soft tissue, unspecified: Secondary | ICD-10-CM | POA: Diagnosis present

## 2019-09-27 DIAGNOSIS — N736 Female pelvic peritoneal adhesions (postinfective): Secondary | ICD-10-CM | POA: Diagnosis not present

## 2019-09-27 DIAGNOSIS — Z79899 Other long term (current) drug therapy: Secondary | ICD-10-CM

## 2019-09-27 HISTORY — PX: LAPAROSCOPIC VAGINAL HYSTERECTOMY WITH SALPINGECTOMY: SHX6680

## 2019-09-27 HISTORY — PX: CYSTOSCOPY: SHX5120

## 2019-09-27 LAB — TYPE AND SCREEN
ABO/RH(D): A POS
Antibody Screen: NEGATIVE

## 2019-09-27 LAB — ABO/RH: ABO/RH(D): A POS

## 2019-09-27 LAB — POCT PREGNANCY, URINE: Preg Test, Ur: NEGATIVE

## 2019-09-27 SURGERY — HYSTERECTOMY, VAGINAL, LAPAROSCOPY-ASSISTED, WITH SALPINGECTOMY
Anesthesia: General | Site: Bladder

## 2019-09-27 MED ORDER — ONDANSETRON HCL 4 MG/2ML IJ SOLN
INTRAMUSCULAR | Status: AC
  Filled 2019-09-27: qty 2

## 2019-09-27 MED ORDER — STERILE WATER FOR IRRIGATION IR SOLN
Status: DC | PRN
  Administered 2019-09-27: 300 mL via INTRAVESICAL

## 2019-09-27 MED ORDER — KETOROLAC TROMETHAMINE 30 MG/ML IJ SOLN
INTRAMUSCULAR | Status: DC | PRN
  Administered 2019-09-27: 30 mg via INTRAVENOUS

## 2019-09-27 MED ORDER — DIPHENHYDRAMINE HCL 12.5 MG/5ML PO ELIX: 12.5000 mg | ORAL_SOLUTION | Freq: Four times a day (QID) | ORAL | Status: DC | PRN

## 2019-09-27 MED ORDER — FLUORESCEIN SODIUM 10 % IV SOLN
INTRAVENOUS | Status: AC
  Filled 2019-09-27: qty 5

## 2019-09-27 MED ORDER — ACETAMINOPHEN 500 MG PO TABS
ORAL_TABLET | ORAL | Status: AC
  Filled 2019-09-27: qty 2

## 2019-09-27 MED ORDER — PROMETHAZINE HCL 25 MG/ML IJ SOLN
6.2500 mg | INTRAMUSCULAR | Status: DC | PRN
  Filled 2019-09-27: qty 1

## 2019-09-27 MED ORDER — DOCUSATE SODIUM 100 MG PO CAPS
100.0000 mg | ORAL_CAPSULE | Freq: Two times a day (BID) | ORAL | Status: DC
  Administered 2019-09-27 – 2019-09-28 (×3): 100 mg via ORAL
  Filled 2019-09-27 (×3): qty 1

## 2019-09-27 MED ORDER — FENTANYL CITRATE (PF) 100 MCG/2ML IJ SOLN
INTRAMUSCULAR | Status: AC
  Filled 2019-09-27: qty 2

## 2019-09-27 MED ORDER — ONDANSETRON HCL 4 MG/2ML IJ SOLN
INTRAMUSCULAR | Status: DC | PRN
  Administered 2019-09-27: 4 mg via INTRAVENOUS

## 2019-09-27 MED ORDER — FENTANYL CITRATE (PF) 100 MCG/2ML IJ SOLN
INTRAMUSCULAR | Status: DC | PRN
  Administered 2019-09-27 (×8): 50 ug via INTRAVENOUS

## 2019-09-27 MED ORDER — PROPOFOL 10 MG/ML IV BOLUS
INTRAVENOUS | Status: AC
  Filled 2019-09-27: qty 40

## 2019-09-27 MED ORDER — LACTATED RINGERS IV SOLN
INTRAVENOUS | Status: DC
  Administered 2019-09-27 – 2019-09-28 (×3): via INTRAVENOUS

## 2019-09-27 MED ORDER — DEXAMETHASONE SODIUM PHOSPHATE 4 MG/ML IJ SOLN
INTRAMUSCULAR | Status: DC | PRN
  Administered 2019-09-27: 5 mg via INTRAVENOUS

## 2019-09-27 MED ORDER — OXYCODONE-ACETAMINOPHEN 5-325 MG PO TABS
1.0000 | ORAL_TABLET | Freq: Four times a day (QID) | ORAL | Status: DC | PRN
  Administered 2019-09-29 (×2): 1 via ORAL
  Filled 2019-09-27 (×2): qty 1

## 2019-09-27 MED ORDER — SODIUM CHLORIDE 0.9% FLUSH: 9.0000 mL | INTRAVENOUS | Status: DC | PRN

## 2019-09-27 MED ORDER — MENTHOL 3 MG MT LOZG: 1.0000 | LOZENGE | OROMUCOSAL | Status: DC | PRN

## 2019-09-27 MED ORDER — DIPHENHYDRAMINE HCL 50 MG/ML IJ SOLN
12.5000 mg | Freq: Four times a day (QID) | INTRAMUSCULAR | Status: DC | PRN
  Administered 2019-09-28: 12.5 mg via INTRAVENOUS
  Filled 2019-09-27: qty 1

## 2019-09-27 MED ORDER — PROPOFOL 10 MG/ML IV BOLUS
INTRAVENOUS | Status: DC | PRN
  Administered 2019-09-27: 160 mg via INTRAVENOUS
  Administered 2019-09-27: 40 mg via INTRAVENOUS

## 2019-09-27 MED ORDER — SODIUM CHLORIDE 0.9 % IR SOLN
Status: DC | PRN
  Administered 2019-09-27: 1100 mL

## 2019-09-27 MED ORDER — INFLUENZA VAC SPLIT QUAD 0.5 ML IM SUSY: 0.5000 mL | PREFILLED_SYRINGE | INTRAMUSCULAR | Status: DC

## 2019-09-27 MED ORDER — ONDANSETRON HCL 4 MG PO TABS: 4.0000 mg | ORAL_TABLET | Freq: Four times a day (QID) | ORAL | Status: DC | PRN

## 2019-09-27 MED ORDER — HYDROMORPHONE HCL 1 MG/ML IJ SOLN
INTRAMUSCULAR | Status: AC
  Filled 2019-09-27: qty 1

## 2019-09-27 MED ORDER — MIDAZOLAM HCL 2 MG/2ML IJ SOLN
INTRAMUSCULAR | Status: AC
  Filled 2019-09-27: qty 2

## 2019-09-27 MED ORDER — ONDANSETRON HCL 4 MG/2ML IJ SOLN: 4.0000 mg | Freq: Four times a day (QID) | INTRAMUSCULAR | Status: DC | PRN

## 2019-09-27 MED ORDER — BUPIVACAINE HCL (PF) 0.25 % IJ SOLN
INTRAMUSCULAR | Status: DC | PRN
  Administered 2019-09-27: 9 mL

## 2019-09-27 MED ORDER — KETOROLAC TROMETHAMINE 30 MG/ML IJ SOLN
30.0000 mg | Freq: Once | INTRAMUSCULAR | Status: DC | PRN
  Filled 2019-09-27: qty 1

## 2019-09-27 MED ORDER — KETOROLAC TROMETHAMINE 30 MG/ML IJ SOLN
INTRAMUSCULAR | Status: AC
  Filled 2019-09-27: qty 1

## 2019-09-27 MED ORDER — HYDROMORPHONE 1 MG/ML IV SOLN
INTRAVENOUS | Status: DC
  Administered 2019-09-27: 3.8 mg via INTRAVENOUS
  Administered 2019-09-27: 0.3 mg via INTRAVENOUS
  Administered 2019-09-27: 30 mg via INTRAVENOUS
  Administered 2019-09-28: 0.6 mg via INTRAVENOUS
  Administered 2019-09-28: 0.6 mL via INTRAVENOUS
  Administered 2019-09-28: 1.2 mg via INTRAVENOUS
  Administered 2019-09-28: 0.6 mg via INTRAVENOUS
  Filled 2019-09-27: qty 30

## 2019-09-27 MED ORDER — CEFOXITIN SODIUM 2 G IV SOLR
INTRAVENOUS | Status: AC
  Filled 2019-09-27: qty 2

## 2019-09-27 MED ORDER — ROCURONIUM BROMIDE 100 MG/10ML IV SOLN
INTRAVENOUS | Status: DC | PRN
  Administered 2019-09-27 (×4): 10 mg via INTRAVENOUS
  Administered 2019-09-27: 50 mg via INTRAVENOUS

## 2019-09-27 MED ORDER — DEXAMETHASONE SODIUM PHOSPHATE 10 MG/ML IJ SOLN
INTRAMUSCULAR | Status: AC
  Filled 2019-09-27: qty 1

## 2019-09-27 MED ORDER — NALOXONE HCL 0.4 MG/ML IJ SOLN: 0.4000 mg | INTRAMUSCULAR | Status: DC | PRN

## 2019-09-27 MED ORDER — LIDOCAINE HCL (CARDIAC) PF 100 MG/5ML IV SOSY
PREFILLED_SYRINGE | INTRAVENOUS | Status: DC | PRN
  Administered 2019-09-27: 40 mg via INTRAVENOUS
  Administered 2019-09-27: 60 mg via INTRAVENOUS

## 2019-09-27 MED ORDER — LIDOCAINE 2% (20 MG/ML) 5 ML SYRINGE
INTRAMUSCULAR | Status: AC
  Filled 2019-09-27: qty 5

## 2019-09-27 MED ORDER — KETOROLAC TROMETHAMINE 30 MG/ML IJ SOLN
30.0000 mg | Freq: Four times a day (QID) | INTRAMUSCULAR | Status: DC
  Administered 2019-09-27 – 2019-09-28 (×6): 30 mg via INTRAVENOUS
  Filled 2019-09-27 (×6): qty 1

## 2019-09-27 MED ORDER — SUGAMMADEX SODIUM 200 MG/2ML IV SOLN
INTRAVENOUS | Status: DC | PRN
  Administered 2019-09-27: 225 mg via INTRAVENOUS

## 2019-09-27 MED ORDER — SODIUM CHLORIDE 0.9 % IV SOLN
2.0000 g | INTRAVENOUS | Status: AC
  Administered 2019-09-27: 09:00:00 2 g via INTRAVENOUS
  Filled 2019-09-27: qty 2

## 2019-09-27 MED ORDER — FENTANYL CITRATE (PF) 100 MCG/2ML IJ SOLN
INTRAMUSCULAR | Status: AC
  Filled 2019-09-27: qty 4

## 2019-09-27 MED ORDER — LACTATED RINGERS IV SOLN
INTRAVENOUS | Status: DC
  Administered 2019-09-27 (×3): via INTRAVENOUS
  Filled 2019-09-27: qty 1000

## 2019-09-27 MED ORDER — IBUPROFEN 200 MG PO TABS: 600.0000 mg | ORAL_TABLET | Freq: Four times a day (QID) | ORAL | Status: DC

## 2019-09-27 MED ORDER — ROCURONIUM BROMIDE 10 MG/ML (PF) SYRINGE
PREFILLED_SYRINGE | INTRAVENOUS | Status: AC
  Filled 2019-09-27: qty 10

## 2019-09-27 MED ORDER — SCOPOLAMINE 1 MG/3DAYS TD PT72
1.0000 | MEDICATED_PATCH | Freq: Once | TRANSDERMAL | Status: DC
  Administered 2019-09-27: 1.5 mg via TRANSDERMAL
  Filled 2019-09-27: qty 1

## 2019-09-27 MED ORDER — MIDAZOLAM HCL 5 MG/5ML IJ SOLN
INTRAMUSCULAR | Status: DC | PRN
  Administered 2019-09-27: 2 mg via INTRAVENOUS

## 2019-09-27 MED ORDER — ACETAMINOPHEN 500 MG PO TABS
1000.0000 mg | ORAL_TABLET | Freq: Once | ORAL | Status: AC
  Administered 2019-09-27: 1000 mg via ORAL
  Filled 2019-09-27: qty 2

## 2019-09-27 MED ORDER — FLUORESCEIN SODIUM 10 % IV SOLN
INTRAVENOUS | Status: DC | PRN
  Administered 2019-09-27: 5 mL via INTRAVENOUS

## 2019-09-27 MED ORDER — SODIUM CHLORIDE 0.9 % IV SOLN
INTRAVENOUS | Status: AC
  Filled 2019-09-27: qty 100

## 2019-09-27 MED ORDER — OXYCODONE HCL 5 MG/5ML PO SOLN
5.0000 mg | Freq: Once | ORAL | Status: DC | PRN
  Filled 2019-09-27: qty 5

## 2019-09-27 MED ORDER — OXYCODONE HCL 5 MG PO TABS
5.0000 mg | ORAL_TABLET | Freq: Once | ORAL | Status: DC | PRN
  Filled 2019-09-27: qty 1

## 2019-09-27 MED ORDER — HYDROMORPHONE HCL 1 MG/ML IJ SOLN
0.5000 mg | INTRAMUSCULAR | Status: AC | PRN
  Administered 2019-09-27 (×4): 0.5 mg via INTRAVENOUS
  Filled 2019-09-27: qty 0.5

## 2019-09-27 MED ORDER — SODIUM CHLORIDE 0.9 % IV SOLN
INTRAVENOUS | Status: AC
  Filled 2019-09-27: qty 2

## 2019-09-27 MED ORDER — FENTANYL CITRATE (PF) 100 MCG/2ML IJ SOLN
25.0000 ug | INTRAMUSCULAR | Status: DC | PRN
  Administered 2019-09-27: 50 ug via INTRAVENOUS
  Administered 2019-09-27 (×2): 25 ug via INTRAVENOUS
  Filled 2019-09-27: qty 1

## 2019-09-27 MED ORDER — SIMETHICONE 80 MG PO CHEW: 80.0000 mg | CHEWABLE_TABLET | Freq: Four times a day (QID) | ORAL | Status: DC | PRN

## 2019-09-27 MED ORDER — SCOPOLAMINE 1 MG/3DAYS TD PT72
MEDICATED_PATCH | TRANSDERMAL | Status: AC
  Filled 2019-09-27: qty 1

## 2019-09-27 SURGICAL SUPPLY — 87 items
BLADE EXTENDED COATED 6.5IN (ELECTRODE) ×2 IMPLANT
CABLE HIGH FREQUENCY MONO STRZ (ELECTRODE) IMPLANT
CANISTER SUCT 3000ML PPV (MISCELLANEOUS) ×5 IMPLANT
CATH FOLEY 3WAY  5CC 16FR (CATHETERS)
CATH FOLEY 3WAY 5CC 16FR (CATHETERS) IMPLANT
CLOSURE WOUND 1/2 X4 (GAUZE/BANDAGES/DRESSINGS) ×1
COVER BACK TABLE 60X90IN (DRAPES) ×5 IMPLANT
COVER MAYO STAND STRL (DRAPES) ×5 IMPLANT
DECANTER SPIKE VIAL GLASS SM (MISCELLANEOUS) IMPLANT
DERMABOND ADVANCED (GAUZE/BANDAGES/DRESSINGS) ×2
DERMABOND ADVANCED .7 DNX12 (GAUZE/BANDAGES/DRESSINGS) IMPLANT
DRAPE SHEET LG 3/4 BI-LAMINATE (DRAPES) ×10 IMPLANT
DRAPE WARM FLUID 44X44 (DRAPES) IMPLANT
DRSG OPSITE POSTOP 3X4 (GAUZE/BANDAGES/DRESSINGS) ×5 IMPLANT
DRSG OPSITE POSTOP 4X10 (GAUZE/BANDAGES/DRESSINGS) ×5 IMPLANT
DURAPREP 26ML APPLICATOR (WOUND CARE) ×5 IMPLANT
ELECT REM PT RETURN 9FT ADLT (ELECTROSURGICAL) ×5
ELECTRODE REM PT RTRN 9FT ADLT (ELECTROSURGICAL) IMPLANT
FILTER SMOKE EVAC LAPAROSHD (FILTER) ×6 IMPLANT
FORCEPS CUTTING 33CM 5MM (CUTTING FORCEPS) ×2 IMPLANT
GAUZE 4X4 16PLY RFD (DISPOSABLE) ×2 IMPLANT
GAUZE PACKING 2X5 YD STRL (GAUZE/BANDAGES/DRESSINGS) IMPLANT
GAUZE VASELINE 3X9 (GAUZE/BANDAGES/DRESSINGS) IMPLANT
GLOVE BIO SURGEON STRL SZ 6.5 (GLOVE) ×8 IMPLANT
GLOVE BIO SURGEONS STRL SZ 6.5 (GLOVE) ×2
GLOVE BIOGEL PI IND STRL 6.5 (GLOVE) ×3 IMPLANT
GLOVE BIOGEL PI IND STRL 7.0 (GLOVE) ×12 IMPLANT
GLOVE BIOGEL PI INDICATOR 6.5 (GLOVE) ×2
GLOVE BIOGEL PI INDICATOR 7.0 (GLOVE) ×8
GOWN STRL REUS W/ TWL LRG LVL3 (GOWN DISPOSABLE) ×6 IMPLANT
GOWN STRL REUS W/TWL LRG LVL3 (GOWN DISPOSABLE) ×4
HEMOSTAT ARISTA ABSORB 3G PWDR (HEMOSTASIS) ×2 IMPLANT
HIBICLENS CHG 4% 4OZ BTL (MISCELLANEOUS) ×5 IMPLANT
IV NS IRRIG 3000ML ARTHROMATIC (IV SOLUTION) ×2 IMPLANT
KIT TURNOVER KIT B (KITS) ×5 IMPLANT
LEGGING LITHOTOMY PAIR STRL (DRAPES) ×5 IMPLANT
NDL MAYO CATGUT SZ4 TPR NDL (NEEDLE) ×3 IMPLANT
NEEDLE HYPO 22GX1.5 SAFETY (NEEDLE) ×2 IMPLANT
NEEDLE MAYO CATGUT SZ4 (NEEDLE) ×5 IMPLANT
NS IRRIG 1000ML POUR BTL (IV SOLUTION) ×5 IMPLANT
PACK ABDOMINAL GYN (CUSTOM PROCEDURE TRAY) ×5 IMPLANT
PACK LAVH (CUSTOM PROCEDURE TRAY) ×5 IMPLANT
PACK ROBOTIC GOWN (GOWN DISPOSABLE) ×5 IMPLANT
PACK TRENDGUARD 450 HYBRID PRO (MISCELLANEOUS) IMPLANT
PAD ARMBOARD 7.5X6 YLW CONV (MISCELLANEOUS) ×3 IMPLANT
PAD OB MATERNITY 4.3X12.25 (PERSONAL CARE ITEMS) ×5 IMPLANT
POUCH LAPAROSCOPIC INSTRUMENT (MISCELLANEOUS) ×2 IMPLANT
PROTECTOR NERVE ULNAR (MISCELLANEOUS) ×8 IMPLANT
SET CYSTO W/LG BORE CLAMP LF (SET/KITS/TRAYS/PACK) ×5 IMPLANT
SET IRRIG TUBING LAPAROSCOPIC (IRRIGATION / IRRIGATOR) ×2 IMPLANT
SET TUBE SMOKE EVAC HIGH FLOW (TUBING) ×5 IMPLANT
SHEARS HARMONIC ACE PLUS 36CM (ENDOMECHANICALS) IMPLANT
SHEET LAVH (DRAPES) ×5 IMPLANT
SLEEVE ENDOPATH XCEL 5M (ENDOMECHANICALS) ×5 IMPLANT
SOLUTION ELECTROLUBE (MISCELLANEOUS) IMPLANT
SPECIMEN JAR MEDIUM (MISCELLANEOUS) ×3 IMPLANT
SPONGE INTESTINAL PEANUT (DISPOSABLE) IMPLANT
SPONGE LAP 18X18 RF (DISPOSABLE) ×12 IMPLANT
SPONGE SURGIFOAM ABS GEL 12-7 (HEMOSTASIS) IMPLANT
STAPLER VISISTAT 35W (STAPLE) IMPLANT
STRIP CLOSURE SKIN 1/2X4 (GAUZE/BANDAGES/DRESSINGS) ×4 IMPLANT
SUT CHROMIC 0 CT 1 (SUTURE) ×5 IMPLANT
SUT CHROMIC 0 UR 5 27 (SUTURE) IMPLANT
SUT MNCRL AB 3-0 PS2 27 (SUTURE) ×10 IMPLANT
SUT MON AB 3-0 SH 27 (SUTURE) ×2
SUT MON AB 3-0 SH27 (SUTURE) ×3 IMPLANT
SUT PDS AB 0 CT1 27 (SUTURE) IMPLANT
SUT PLAIN 2 0 XLH (SUTURE) ×5 IMPLANT
SUT VIC AB 0 CT1 18XCR BRD8 (SUTURE) ×9 IMPLANT
SUT VIC AB 0 CT1 27 (SUTURE) ×10
SUT VIC AB 0 CT1 27XBRD ANBCTR (SUTURE) ×12 IMPLANT
SUT VIC AB 0 CT1 36 (SUTURE) ×5 IMPLANT
SUT VIC AB 0 CT1 8-18 (SUTURE) ×8
SUT VIC AB 2-0 SH 27 (SUTURE) ×2
SUT VIC AB 2-0 SH 27XBRD (SUTURE) ×3 IMPLANT
SUT VICRYL 0 TIES 12 18 (SUTURE) ×5 IMPLANT
SUT VICRYL 0 UR6 27IN ABS (SUTURE) ×5 IMPLANT
SYR 50ML LL SCALE MARK (SYRINGE) IMPLANT
SYR BULB IRRIGATION 50ML (SYRINGE) ×5 IMPLANT
SYR CONTROL 10ML LL (SYRINGE) ×2 IMPLANT
SYR TB 1ML 25GX5/8 (SYRINGE) ×5 IMPLANT
TOWEL GREEN STERILE FF (TOWEL DISPOSABLE) ×6 IMPLANT
TRAY FOLEY W/BAG SLVR 14FR (SET/KITS/TRAYS/PACK) ×5 IMPLANT
TRENDGUARD 450 HYBRID PRO PACK (MISCELLANEOUS) ×5
TROCAR BALLN 12MMX100 BLUNT (TROCAR) ×5 IMPLANT
TROCAR XCEL NON-BLD 5MMX100MML (ENDOMECHANICALS) ×5 IMPLANT
UNDERPAD 30X30 (UNDERPADS AND DIAPERS) ×3 IMPLANT

## 2019-09-27 NOTE — Progress Notes (Signed)
Date of Initial H&P: 09/13/19  History reviewed, patient examined, no change in status, stable for surgery.

## 2019-09-27 NOTE — Anesthesia Postprocedure Evaluation (Signed)
Anesthesia Post Note  Patient: Kim Gonzales  Procedure(s) Performed: LAPAROSCOPIC CONVERTED TO OPEN ABDOMINAL HYSTERECTOMY WITH  LEFT SALPINGECTOMY AND EXTENSIVE LYSIS OF ADHESIONS (Bilateral Abdomen) CYSTOSCOPY (N/A Bladder)     Patient location during evaluation: PACU Anesthesia Type: General Level of consciousness: awake and alert and oriented Pain management: pain level controlled Vital Signs Assessment: post-procedure vital signs reviewed and stable Respiratory status: spontaneous breathing, nonlabored ventilation and respiratory function stable Cardiovascular status: blood pressure returned to baseline Postop Assessment: no apparent nausea or vomiting Anesthetic complications: no    Last Vitals:  Vitals:   09/27/19 1438 09/27/19 1445  BP:  121/65  Pulse: 83 70  Resp: (!) 9 11  Temp:    SpO2: 96% 98%    Last Pain:  Vitals:   09/27/19 1445  TempSrc:   PainSc: [place]

## 2019-09-27 NOTE — Transfer of Care (Signed)
Immediate Anesthesia Transfer of Care Note  Patient: Kim Gonzales  Procedure(s) Performed: Procedure(s) (LRB): LAPAROSCOPIC CONVERTED TO OPEN ABDOMINAL HYSTERECTOMY WITH  LEFT SALPINGECTOMY AND EXTENSIVE LYSIS OF ADHESIONS (Bilateral) CYSTOSCOPY (N/A)  Patient Location: PACU  Anesthesia Type: General  Level of Consciousness: awake, sedated, patient cooperative and responds to stimulation  Airway & Oxygen Therapy: Patient Spontanous Breathing and Patient connected to NC02 and soft FM   Post-op Assessment: Report given to PACU RN, Post -op Vital signs reviewed and stable and Patient moving all extremities  Post vital signs: Reviewed and stable  Complications: No apparent anesthesia complications

## 2019-09-27 NOTE — Op Note (Signed)
Indications: symptomatic Fibroids Menorrhagia dysmenorrhea  Pre-operative Diagnosis: see above  Post-operative Diagnosis: same  Operation:  Operative  laparoscopy.  Total abdominal hysterectomy.  Left salpinectomy LOA Surgeon: [id number]   Assistants: Earnstine Regal PA  Anesthesia: General endotracheal anesthesia  ASA Class: per anesthesia  Procedure Details  The patient was seen in the Holding Room. The risks, benefits, complications, treatment options, and expected outcomes were discussed with the patient.  The patient concurred with the proposed plan, giving informed consent.  The site of surgery properly noted/marked. The patient was taken to Operating Room # 3, identified as Kim Gonzales and the procedure verified as a  LAVH, poss  Total abdominal hysterectomy and cystoscopy.   A Time Out was held and the above information confirmed.  After induction of anesthesia, the patient was draped and prepped in the usual sterile manner. Pt was placed in supine position after anesthesia and draped and prepped in the usual sterile manner. Foley catheter was placed. Attention was then turned to the vagina.  The weighted retractor was placed in the vagina.  The anterior lip of the cervix was grasped with a single tooth tenaculum.  The hulka was then placed in the uterus to manipulate the uterus.     A umbilical incision was made and carried through the subcutaneous tissue to the fascia.the peritoneum was opened.  The hasson was placed.  The abdomen was insufflated with co2 gas.  The camera was placed in the abdomen.  The uterus was 10 week size and irregular.  Dense bladder adhesions were noted.  I tried to lyse them with hydrodisection and sharp dissection but was unable to fully see the anterior culdesac.  I made the decision to open the pt to avoid injury to the bladder.   .Liver appeared normal.      The umbilical fascial incision was closed. A pfannensteil incision was made with the knife  on the skin 2 cm above the symphysis pubis.   Fascial incision was made and extended bilaterally. The rectus muscles were separated. The peritoneum was identified and entered. Peritoneal incision was extended longitudinally.  The above findings were noted. A belfour retractor  was placed and bowel was packed away from the surgical site.   The round ligaments had been , cut, and ligated laparascopically. The anterior peritoneal reflection was incised and the bladder was dissected off the lower uterine segment. There were dense adhesions and this took about an hour. . The right utero-ovarian ligament , cut, and suture ligated with 0-Vicryl. The left utero-ovarian ligament and proximal fallopian tube were grasped, cut and suture ligated with 0-Vicryl. Hemostasis was observed. The fallopian tube on the left  Was  clamped with kelly clamps and removed using metzenbaum scissors.  The mesosalpinx was made hemostatic with 0 vicryl suture.   The uterine vessels were skeletonized, then clamped, cut and suture ligated with 0-Vicryl suture. .   Serial pedicles of the cardinal and utero-sacral ligaments were clamped, cut, and suture ligated with 0-Vicryl. Entrance was made into the vagina and the uterus removed. Vaginal cuff angle sutures were placed incorporating the utero-sacral ligaments for support. The vaginal cuff was then closed with a running stitch of 0- Vicryl. Lavage was carried out until clear. Hemostasis was observed. There was some oozing from the vaginal cuff which was made hemostatic with figure of 8 o vicryl suture.  Audie Box was also used.    I felt scar tissue in the bladder with questionable fibroid.   Urology was  called in for intraop consult.  Retractor and all packing was removed from the abdomen. The peritoneum was closed with 0 chromic.   The fascia was approximated with running sutures of 0-Vicryl. Lavage was again carried out.  Hemostasis was observed. The subcutaneous tissue was reapproximated  with o plain in interrupted suture.   The umbilical and pfannesteil skin incisions  Were approximated 4-0 monocryl with subcuticular stitches.  The 14mm incision was closed with dermabond. All incisions reinforced with dermabond.    Cystoscopy was done and fluroscein was used.  Both ureters effluxed the dye.  No injury seen in the bladder.     Instrument, sponge, and needle counts were correct prior to abdominal closure and at the conclusion of the case.   Findings: Dense bladder uterine adhesions.  Scar tissue seen on the right side and questionable fibroid.  Dr Gloriann Loan urology came in and encouraged me to leave it alone.  Trying to remove it would probably cause bladder injury.  Fibroid uterus.  Normal adnexa B .  Right tube was adherant to adnexa and could not be removed.  Appendix appeared normal  Estimated Blood Loss:  500 mL  Urine output 275 cc clear urine.           Drains: none         Total IV Fluids: 204ml         Specimens: uterus , cervix and left tube tubes         Implants: none         Complications:  None; patient tolerated the procedure well.         Disposition: PACU - hemodynamically stable.         Condition: stable  Attending Attestation: I was present and scrubbed for the entire procedure.

## 2019-09-27 NOTE — Consult Note (Signed)
intraoperative consultation was requested.  I scrubbed into the case.  The patient had already undergone a hysterectomy for history of fibroids.  Dr. Charlesetta Garibaldi requested that I take a look at the bladder and palpate it to make sure there were no abnormalities/masses.  I inspected the bladder.  There is no evidence of any malignancy.  There was no evidence of any injury to the bladder itself.  There was some firmness consistent with likely prior scar tissue on the left side of the bladder.  Given that this firmness is not associated with malignancy, I recommended leaving it alone.  Dr. Charlesetta Garibaldi plans to perform a cystoscopy at the end of the case.  If there are any abnormalities there she will contact us.

## 2019-09-27 NOTE — Anesthesia Procedure Notes (Signed)
Procedure Name: Intubation Date/Time: 09/27/2019 9:12 AM Performed by: Justice Rocher, CRNA Pre-anesthesia Checklist: Patient identified, Emergency Drugs available, Suction available and Patient being monitored Patient Re-evaluated:Patient Re-evaluated prior to induction Oxygen Delivery Method: Circle system utilized Preoxygenation: Pre-oxygenation with 100% oxygen Induction Type: IV induction Ventilation: Mask ventilation without difficulty Laryngoscope Size: Mac and 4 Grade View: Grade II Tube type: Oral Tube size: 7.5 mm Number of attempts: 1 Airway Equipment and Method: Stylet and Oral airway Placement Confirmation: ETT inserted through vocal cords under direct vision,  positive ETCO2 and breath sounds checked- equal and bilateral Secured at: 23 cm Tube secured with: Tape Dental Injury: Teeth and Oropharynx as per pre-operative assessment

## 2019-09-28 ENCOUNTER — Encounter (HOSPITAL_BASED_OUTPATIENT_CLINIC_OR_DEPARTMENT_OTHER): Payer: Self-pay | Admitting: Obstetrics and Gynecology

## 2019-09-28 LAB — CBC
HCT: 27.7 % — ABNORMAL LOW (ref 36.0–46.0)
Hemoglobin: 8.8 g/dL — ABNORMAL LOW (ref 12.0–15.0)
MCH: 28.6 pg (ref 26.0–34.0)
MCHC: 31.8 g/dL (ref 30.0–36.0)
MCV: 89.9 fL (ref 80.0–100.0)
Platelets: 180 10*3/uL (ref 150–400)
RBC: 3.08 MIL/uL — ABNORMAL LOW (ref 3.87–5.11)
RDW: 13 % (ref 11.5–15.5)
WBC: 8.4 10*3/uL (ref 4.0–10.5)
nRBC: 0 % (ref 0.0–0.2)

## 2019-09-28 LAB — BASIC METABOLIC PANEL
Anion gap: 7 (ref 5–15)
BUN: 10 mg/dL (ref 6–20)
CO2: 22 mmol/L (ref 22–32)
Calcium: 7.6 mg/dL — ABNORMAL LOW (ref 8.9–10.3)
Chloride: 98 mmol/L (ref 98–111)
Creatinine, Ser: 0.68 mg/dL (ref 0.44–1.00)
GFR calc Af Amer: >60 mL/min (ref >60)
GFR calc non Af Amer: >60 mL/min (ref >60)
Glucose, Bld: 104 mg/dL — ABNORMAL HIGH (ref 70–99)
Potassium: 3.9 mmol/L (ref 3.5–5.1)
Sodium: 127 mmol/L — ABNORMAL LOW (ref 135–145)

## 2019-09-28 LAB — SURGICAL PATHOLOGY

## 2019-09-28 MED ORDER — DIPHENHYDRAMINE HCL 25 MG PO CAPS
25.0000 mg | ORAL_CAPSULE | Freq: Four times a day (QID) | ORAL | Status: DC | PRN
  Administered 2019-09-28: 25 mg via ORAL
  Filled 2019-09-28: qty 1

## 2019-09-28 MED ORDER — SODIUM CHLORIDE 0.9% FLUSH
3.0000 mL | Freq: Two times a day (BID) | INTRAVENOUS | Status: DC
  Administered 2019-09-28: 3 mL via INTRAVENOUS

## 2019-09-28 MED ORDER — SODIUM CHLORIDE 0.9% FLUSH: 3.0000 mL | INTRAVENOUS | Status: DC | PRN

## 2019-09-28 MED ORDER — SODIUM CHLORIDE 0.9 % IV SOLN: 250.0000 mL | INTRAVENOUS | Status: DC | PRN

## 2019-09-28 NOTE — Progress Notes (Signed)
Avah Smethurst is a48 y.o.  [id number]  Post Op Date # 1: Laparoscopy/TAH/LS/Extensive LOA/Cystoscopy  Subjective: Patient is Doing well postoperatively. Patient is sluggish. Patient has Pain is controlled with current analgesics. Medications being used: prescription NSAID's including Ketorolac 30 mg IV and narcotic analgesics including PCA Dilaudid. Patient has not gotten out of bed, reporting lightheadedness and nausea with movement.  Nausea relieved by antiemetic however. Has only taken water orally and Foley remains in place (spoke with nurse about removing it-states she had been delayed by the needs of another patient).   Objective: Vital signs in last 24 hours: Temp:  [98 F (36.7 C)-99.4 F (37.4 C)] 98 F (36.7 C) (12/03 0418) Pulse Rate:  [54-83] 59 (12/03 0418) Resp:  [8-17] 16 (12/03 0418) BP: (91-125)/(47-80) 91/47 (12/03 0418) SpO2:  [80 %-100 %] 99 % (12/03 0418)  Intake/Output from previous day: 12/02 0701 - 12/03 0700 In: 4930.6 [P.O.:1190; I.V.:3740.6] Out: 1875 [id number] Intake/Output this shift: No intake/output data recorded. Recent Labs  Lab 09/28/19 0320  WBC 8.4  HGB 8.8*  HCT 27.7*  PLT 180     Recent Labs  Lab 09/28/19 0320  NA 127*  K 3.9  CL 98  CO2 22  BUN 10  CREATININE 0.68  CALCIUM 7.6*  GLUCOSE 104*    EXAM: General: cooperative, fatigued and no distress Resp: clear to auscultation bilaterally Cardio: regular rate and rhythm, S1, S2 normal, no murmur, click, rub or gallop GI: decreased bowel sounds, wound dressing is clean/dry/intact. Extremities: Homans sign is negative, no sign of DVT and SCD hose in place and functioning-no calf tenderness. Vaginal Bleeding: none   Assessment: s/p Procedure(s): LAPAROSCOPIC CONVERTED TO OPEN ABDOMINAL HYSTERECTOMY WITH  LEFT SALPINGECTOMY AND EXTENSIVE LYSIS OF ADHESIONS CYSTOSCOPY: stable and anemia  Plan: Encourage ambulation Advance diet as tolerated and once taking po, begin oral  analgesia.  Routine care.  LOS: 1 day    Earnstine Regal, PA-C 09/28/2019 7:16 AM

## 2019-09-28 NOTE — Progress Notes (Signed)
2nd paged sent to MD.

## 2019-09-28 NOTE — Progress Notes (Signed)
1 Day Post-Op Procedure(s) (LRB): LAPAROSCOPIC CONVERTED TO OPEN ABDOMINAL HYSTERECTOMY WITH  LEFT SALPINGECTOMY AND EXTENSIVE LYSIS OF ADHESIONS (Bilateral) CYSTOSCOPY (N/A)  Subjective: Patient reports tolerating PO and + flatus.    Objective: I have reviewed patient's vital signs, intake and output, medications and labs.  General: alert and cooperative Resp: clear to auscultation bilaterally Cardio: regular rate and rhythm, S1, S2 normal, no murmur, click, rub or gallop and regular rate and rhythm GI: soft, non-tender; bowel sounds normal; no masses,  no organomegaly and incision: clean, dry and intact Extremities: Homans sign is negative, no sign of DVT Vaginal Bleeding: minimal  Assessment: s/p Procedure(s): LAPAROSCOPIC CONVERTED TO OPEN ABDOMINAL HYSTERECTOMY WITH  LEFT SALPINGECTOMY AND EXTENSIVE LYSIS OF ADHESIONS (Bilateral) CYSTOSCOPY (N/A): stable, progressing well and tolerating diet  Plan: Encourage ambulation Discontinue IV fluids DC PCA.  ANTICIPATE DC IN AM  LOS: 1 day    Kim Gonzales A Kim Gonzales 09/28/2019, 5:04 PM

## 2019-09-28 NOTE — Progress Notes (Signed)
Pt c/o headache. MD paged. Received orders to d/c toradol and order advil 600 q6h.

## 2019-09-28 NOTE — Progress Notes (Signed)
Pt c/o itching. MD paged.

## 2019-09-29 MED ORDER — IBUPROFEN 200 MG PO TABS
600.0000 mg | ORAL_TABLET | Freq: Four times a day (QID) | ORAL | Status: DC
  Administered 2019-09-29: 600 mg via ORAL
  Filled 2019-09-29: qty 3

## 2019-09-29 MED ORDER — OXYCODONE-ACETAMINOPHEN 5-325 MG PO TABS: ORAL_TABLET | ORAL | 0 refills | Status: DC

## 2019-09-29 MED ORDER — IBUPROFEN 600 MG PO TABS: ORAL_TABLET | ORAL | 1 refills | Status: DC

## 2019-09-29 MED ORDER — SIMETHICONE 80 MG PO CHEW: 80.0000 mg | CHEWABLE_TABLET | Freq: Four times a day (QID) | ORAL | Status: DC

## 2019-09-29 NOTE — Progress Notes (Signed)
Kim Gonzales is a48 y.o.  [id number]  Post Op Date #2 : Laparoscopy/TAH/LS/Extensive LOA/Cystoscopy  Subjective: Patient is Doing well postoperatively. Patient has Pain is controlled with current analgesics. Medications being used: prescription NSAID's including Ibuprofen 600 mg  and narcotic analgesics including Percocet 5/325. Patient is ambulating without dizziness, voiding without difficulty, passing flatus and tolerating a regular diet.   Objective: Vital signs in last 24 hours: Temp:  [97.7 F (36.5 C)-98.6 F (37 C)] 97.7 F (36.5 C) (12/04 0611) Pulse Rate:  [57-76] 62 (12/04 0611) Resp:  [15-18] 18 (12/04 0611) BP: (95-116)/(45-67) 116/67 (12/04 0611) SpO2:  [94 %-100 %] 96 % (12/04 0611)  Intake/Output from previous day: 12/03 0701 - 12/04 0700 In: 2486 [P.O.:2250; I.V.:236] Out: 5350 [Urine:5350] Intake/Output this shift: No intake/output data recorded. Recent Labs  Lab 09/28/19 0320  WBC 8.4  HGB 8.8*  HCT 27.7*  PLT 180     Recent Labs  Lab 09/28/19 0320  NA 127*  K 3.9  CL 98  CO2 22  BUN 10  CREATININE 0.68  CALCIUM 7.6*  GLUCOSE 104*    EXAM: General: alert, cooperative and no distress Resp: clear to auscultation bilaterally Cardio: regular rate and rhythm, S1, S2 normal, no murmur, click, rub or gallop GI: bowel sounds present, soft, incisions intact with no evidence of infectiion; abdominal bandage dry and intact with dried stain at left edge. Extremities: Homans sign is negative, no sign of DVT and no calf tenderness.   Assessment: s/p Procedure(s): LAPAROSCOPIC CONVERTED TO OPEN ABDOMINAL HYSTERECTOMY WITH  LEFT SALPINGECTOMY AND EXTENSIVE LYSIS OF ADHESIONS CYSTOSCOPY: stable, progressing well, tolerating diet and anemia  Plan: Discharge home  LOS: 2 days    Earnstine Regal, PA-C 09/29/2019 7:03 AM

## 2019-09-29 NOTE — Discharge Summary (Signed)
Physician Discharge Summary  Patient ID: Kim Gonzales MRN: [id number] DOB/AGE: January 06, 1971 48 y.o.  Admit date: 09/27/2019 Discharge date: 09/29/2019   Discharge Diagnoses:  Active Problems:   Menorrhagia with regular cycle   Fibroids   Pelvic pain in female   Menorrhagia   Operation: Laparoscopy, Total Abdominal Hysterectomy, Left Salpingectomy, Extensive Lysis of Adhesions and Cystoscopy   Discharged Condition: Good  Hospital Course: On the date of admission the patient underwent the aforementioned procedures and tolerated them well.  Post operative course was unremarkable with the patient resuming bowel and bladder function by post operative day #1 and was therefore deemed ready for discharge home.  Discharge hemoglobin was 8.8.   Disposition: Discharge disposition: 01-Home or Self Care       Discharge Medications:  Allergies as of 09/29/2019      Reactions   Bactrim [sulfamethoxazole-trimethoprim] Other (See Comments)   ? Of bactrim induced neutropenia. Not clear this was the cause though.   Ciprofloxacin Other (See Comments)   Facial loss of freckles.      Medication List    STOP taking these medications   acetaminophen 500 MG tablet Commonly known as: TYLENOL     TAKE these medications   ferrous sulfate 325 (65 FE) MG tablet Take 325 mg by mouth daily.   ibuprofen 600 MG tablet Commonly known as: ADVIL take 1 tablet po pc every 6 hours for 5 days then prn-pain What changed:   medication strength  how much to take  how to take this  when to take this  reasons to take this  additional instructions   oxyCODONE-acetaminophen 5-325 MG tablet Commonly known as: PERCOCET/ROXICET take 1 tablet po every 6 hours as needed for breakthrough post operative pain   VITAMIN C PO Take 3 tablets by mouth daily.   Vitamin D3 75 MCG (3000 UT) Tabs Take 6,000 Units by mouth daily.   Zinc 50 MG Tabs Take 100 mg by mouth daily.          Follow-up: Dr.  Crawford Givens on January 12. 2021 at 11 a.m.     Signed: Earnstine Regal, PA-C 09/29/2019, 7:17 AM

## 2019-09-29 NOTE — Discharge Instructions (Signed)
Call [facility] @ [phone] if:  You have a temperature greater than or equal to 100.4 degrees Farenheit orally You have pain that is not made better by the pain medication given and taken as directed You have excessive bleeding or problems urinating  Take Colace (Docusate Sodium/Stool Softener) 100 mg 2-3 times daily while taking narcotic pain medicine to avoid constipation or until bowel movements are regular.  Take an over the counter iron supplement of your choice,  twice a day for the next 6 months Take Ibuprofen 600 mg with food every 6 hours for 5 days then as needed for pain   You may drive after 2 weeks You may walk up steps  You may shower  You may resume a regular diet  Keep incisions clean and dry; removed honeycomb dressing on October 04, 2019 Do not lift over 15 pounds for 6 weeks Avoid anything in vagina for 6 weeks (or until after your post-operative visit)

## 2019-09-29 NOTE — Progress Notes (Signed)
Discharge instructions discussed with patient, verbalized agreement and understanding, 

## 2019-09-30 LAB — URINE CULTURE: Culture: NO GROWTH

## 2019-10-25 DIAGNOSIS — U071 COVID-19: Secondary | ICD-10-CM | POA: Diagnosis not present

## 2019-11-11 DIAGNOSIS — Z20828 Contact with and (suspected) exposure to other viral communicable diseases: Secondary | ICD-10-CM | POA: Diagnosis not present

## 2019-11-14 DIAGNOSIS — D649 Anemia, unspecified: Secondary | ICD-10-CM | POA: Diagnosis not present

## 2020-05-21 DIAGNOSIS — G43009 Migraine without aura, not intractable, without status migrainosus: Secondary | ICD-10-CM | POA: Diagnosis not present

## 2020-05-28 DIAGNOSIS — Z20822 Contact with and (suspected) exposure to covid-19: Secondary | ICD-10-CM | POA: Diagnosis not present

## 2020-07-10 DIAGNOSIS — Z20822 Contact with and (suspected) exposure to covid-19: Secondary | ICD-10-CM | POA: Diagnosis not present

## 2020-09-24 DIAGNOSIS — Z20822 Contact with and (suspected) exposure to covid-19: Secondary | ICD-10-CM | POA: Diagnosis not present

## 2022-01-28 ENCOUNTER — Encounter: Payer: Self-pay | Admitting: Neurology

## 2022-01-28 ENCOUNTER — Ambulatory Visit: Payer: 59 | Admitting: Neurology

## 2022-01-28 VITALS — BP 123/64 | HR 69 | Ht 66.0 in | Wt 177.6 lb

## 2022-01-28 DIAGNOSIS — Z818 Family history of other mental and behavioral disorders: Secondary | ICD-10-CM | POA: Diagnosis not present

## 2022-01-28 DIAGNOSIS — R4701 Aphasia: Secondary | ICD-10-CM | POA: Diagnosis not present

## 2022-01-28 DIAGNOSIS — H547 Unspecified visual loss: Secondary | ICD-10-CM

## 2022-01-28 DIAGNOSIS — R4189 Other symptoms and signs involving cognitive functions and awareness: Secondary | ICD-10-CM

## 2022-01-28 DIAGNOSIS — G8929 Other chronic pain: Secondary | ICD-10-CM

## 2022-01-28 DIAGNOSIS — E538 Deficiency of other specified B group vitamins: Secondary | ICD-10-CM

## 2022-01-28 DIAGNOSIS — R413 Other amnesia: Secondary | ICD-10-CM | POA: Diagnosis not present

## 2022-01-28 DIAGNOSIS — G43109 Migraine with aura, not intractable, without status migrainosus: Secondary | ICD-10-CM

## 2022-01-28 DIAGNOSIS — R519 Headache, unspecified: Secondary | ICD-10-CM

## 2022-01-28 DIAGNOSIS — E519 Thiamine deficiency, unspecified: Secondary | ICD-10-CM

## 2022-01-28 DIAGNOSIS — R51 Headache with orthostatic component, not elsewhere classified: Secondary | ICD-10-CM

## 2022-01-28 MED ORDER — RIZATRIPTAN BENZOATE 10 MG PO TBDP: 10.0000 mg | ORAL_TABLET | ORAL | 11 refills | Status: DC | PRN

## 2022-01-28 MED ORDER — ONDANSETRON 4 MG PO TBDP: 4.0000 mg | ORAL_TABLET | Freq: Three times a day (TID) | ORAL | 3 refills | Status: DC | PRN

## 2022-01-28 NOTE — Progress Notes (Signed)
?[facility] ? ? ? ?Provider:  Dr Jaynee Eagles ?Requesting Provider: Waldemar Dickens, MD ?Primary Care Provider:  Donald Prose, MD ? ?CC:  subjective cognitive issues ? ?HPI:  Kim Gonzales is a 51 y.o. female here as requested by Waldemar Dickens, MD for cognitive impairment at the age of 30. She has a PMHx of depression, migraines, dyslexia, ocular migraines and FHx of dementia in mother and maternal grandfather.  I reviewed Dr. Bertis Ruddy notes, patient reports word searching, periodic episodes of being unable to say a word reading, unclear trigger for events, multiple times, the other day she could not see the letters but knew what the word was but was unable to say the word, these episodes appear to be occurring with greater frequency; unusual inability to speak, no focal deficit and chronic with recent worsening, possible CNS etiology, patient also very concerned about dementia given strong family history of same.  ? ?Patient here alone. Mother recently diagnosed with alzheimers at 55. She started having symptoms in her late 64s, patient states for years her mother had issues. She is concerned about her history. She will forget things at work, she is in pre-menopause, and maybe it is stress. But she has forgotten people she has recently been introduced to. If she doesn't write it down she may forget, needs lists, husband stating that she is repeating things. Asking the same questions over and over again. She says words wrong. She gets words backwards, things come out of her mouth wrong. When she is in high-stress situations she can't get the words out or the right words out. She has had vision loss and looking up words and couldn't read them, she freaked out, she called her eye doctor, thought she was having a stroke because she lost vision and she saw Dr. Marily Memos at her work, Dr. Nancy Fetter is pcp. She completes all of her IADLs and ADLs. She has also lost peripheral vision too. Another time she was at  work and her sight went completely blurry. She also has migraines, tried topiramate, propranolol (hypotension),  Lots of stress over the last 5 years. She has plsating/pounding/throbbing headaches, light and sound sensitivity, nausea, can be mod to severe, can last 3-4 days if untreated. She clenches. She has morning headaches that can be worse positionally in the morning. No other focal neurologic deficits, associated symptoms, inciting events or modifiable factors. ? ?Reviewed notes, labs and imaging from outside physicians, which showed: ? ?I reviewed labs CMP was unremarkable, LDL 109, TSH 2.92 normal, CBC unremarkable. ? ?Review of Systems: ?Patient complains of symptoms per HPI as well as the following symptoms stress. Pertinent negatives and positives per HPI. All others negative. ? ? ?Social History  ? ?Socioeconomic History  ? Marital status: Married  ?  Spouse name: Not on file  ? Number of children: Not on file  ? Years of education: Not on file  ? Highest education level: Not on file  ?Occupational History  ? Not on file  ?Tobacco Use  ? Smoking status: Never  ? Smokeless tobacco: Never  ?Vaping Use  ? Vaping Use: Never used  ?Substance and Sexual Activity  ? Alcohol use: No  ? Drug use: No  ? Sexual activity: Not on file  ?Other Topics Concern  ? Not on file  ?Social History Narrative  ? Not on file  ? ?Social Determinants of Health  ? ?Financial Resource Strain: Not on file  ?Food Insecurity: Not on file  ?Transportation Needs: Not  on file  ?Physical Activity: Not on file  ?Stress: Not on file  ?Social Connections: Not on file  ?Intimate Partner Violence: Not on file  ? ? ?Family History  ?Problem Relation Age of Onset  ? Alzheimer's disease Mother   ? Alzheimer's disease Maternal Grandfather   ? Kidney nephrosis Neg Hx   ? ? ?Past Medical History:  ?Diagnosis Date  ? Anemia   ? Depression   ? Dyspareunia in female 03/26/2016  ? ESBL E. coli carrier 05/20/2016  ? Lightheadedness 03/26/2016  ? Myalgia  03/26/2016  ? Vaginal pruritus 03/26/2016  ? ? ?Patient Active Problem List  ? Diagnosis Date Noted  ? Menorrhagia 09/27/2019  ? Menorrhagia with regular cycle 09/13/2019  ? Fibroids 09/13/2019  ? Pelvic pain in female 09/13/2019  ? ESBL E. coli carrier 05/20/2016  ? Dyspareunia in female 03/26/2016  ? Lightheadedness 03/26/2016  ? Vaginal pruritus 03/26/2016  ? Myalgia 03/26/2016  ? FUO (fever of unknown origin)   ? Neutropenic fever ([place]) 03/04/2016  ? Sepsis ([place]) 03/04/2016  ? Drug-induced neutropenia ([facility])   ? LFTs abnormal   ? Pyelonephritis 03/03/2016  ? Leukopenia 03/03/2016  ? Hyponatremia 03/03/2016  ? Anemia, iron deficiency 03/03/2016  ? ? ?Past Surgical History:  ?Procedure Laterality Date  ? CESAREAN SECTION    ? x3  ? compound fracture Right   ? Compound Fracture Right arm  ? CYSTOSCOPY N/A 09/27/2019  ? Procedure: CYSTOSCOPY;  Surgeon: Crawford Givens, MD;  Location: [facility];  Service: Gynecology;  Laterality: N/A;  ? DILATION AND CURETTAGE OF UTERUS    ? DILITATION & CURRETTAGE/HYSTROSCOPY WITH HYDROTHERMAL ABLATION N/A 10/10/2015  ? Procedure: DILATATION & CURETTAGE/HYSTEROSCOPY WITH HYDROTHERMAL ABLATION;  Surgeon: Crawford Givens, MD;  Location: [facility];  Service: Gynecology;  Laterality: N/A;  ? KNEE ARTHROSCOPY    ? LAPAROSCOPIC VAGINAL HYSTERECTOMY WITH SALPINGECTOMY Bilateral 09/27/2019  ? Procedure: LAPAROSCOPIC CONVERTED TO OPEN ABDOMINAL HYSTERECTOMY WITH  LEFT SALPINGECTOMY AND EXTENSIVE LYSIS OF ADHESIONS;  Surgeon: Crawford Givens, MD;  Location: [facility];  Service: Gynecology;  Laterality: Bilateral;  ? SHOULDER ARTHROSCOPY    ? TUBAL LIGATION    ? pptl  ? WISDOM TOOTH EXTRACTION    ? ? ?Current Outpatient Medications  ?Medication Sig Dispense Refill  ? ondansetron (ZOFRAN-ODT) 4 MG disintegrating tablet Take 1-2 tablets (4-8 mg total) by mouth every 8 (eight) hours as needed. 30 tablet 3  ? rizatriptan (MAXALT-MLT) 10 MG disintegrating tablet Take 1 tablet  (10 mg total) by mouth as needed for migraine. May repeat in 2 hours if needed 9 tablet 11  ? Ascorbic Acid (VITAMIN C PO) Take 3 tablets by mouth daily.    ? Cholecalciferol (VITAMIN D3) 75 MCG (3000 UT) TABS Take 6,000 Units by mouth daily.    ? ferrous sulfate 325 (65 FE) MG tablet Take 325 mg by mouth daily.     ? ibuprofen (ADVIL) 600 MG tablet take 1 tablet po pc every 6 hours for 5 days then prn-pain 30 tablet 1  ? OZEMPIC, 0.25 OR 0.5 MG/DOSE, 2 MG/1.5ML SOPN SMARTSIG:0.25 Milligram(s) SUB-Q Once a Week    ? Zinc 50 MG TABS Take 100 mg by mouth daily.    ? ?No current facility-administered medications for this visit.  ? ? ?Allergies as of 01/28/2022 - Review Complete 01/28/2022  ?Allergen Reaction Noted  ? Bactrim [sulfamethoxazole-trimethoprim] Other (See Comments) 03/26/2016  ? Ciprofloxacin Other (See Comments) 05/20/2016  ? ? ?Vitals: ?BP 123/64  Pulse 69   Ht '5\' 6"'$  (1.676 m)   Wt 177 lb 9.6 oz (80.6 kg)   BMI 28.67 kg/m?  ?Last Weight:  ?Wt Readings from Last 1 Encounters:  ?01/28/22 177 lb 9.6 oz (80.6 kg)  ? ?Last Height:   ?Ht Readings from Last 1 Encounters:  ?01/28/22 '5\' 6"'$  (1.676 m)  ? ? ? ?Physical exam: ?Exam: ?Gen: NAD, conversant, well nourised, well groomed                     ?CV: RRR, no MRG. No Carotid Bruits. No peripheral edema, warm, nontender ?Eyes: Conjunctivae clear without exudates or hemorrhage ? ?Neuro: ?Detailed Neurologic Exam ? ?Speech: ?   Speech is normal; fluent and spontaneous with normal comprehension.  ?Cognition: ? ? ?  01/28/2022  ?  7:53 AM  ?MMSE - Mini Mental State Exam  ?Orientation to time 5  ?Orientation to Place 5  ?Registration 3  ?Attention/ Calculation 1  ?Recall 3  ?Language- name 2 objects 2  ?Language- repeat 1  ?Language- follow 3 step command 3  ?Language- read & follow direction 1  ?Write a sentence 1  ?Copy design 1  ?Total score 26  ? ? ?   The patient is oriented to person, place, and time;  ?   recent and remote memory intact;  ?   language  fluent;  ?   normal attention, concentration,  ?   fund of knowledge ?Cranial Nerves: ?   The pupils are equal, round, and reactive to light. The fundi are flat. Visual fields are full to finger confrontation. Extraocula

## 2022-01-28 NOTE — Patient Instructions (Addendum)
The "XX Brain" by lisa mosconi ?The "Healthy Brain Study" at [facility] - look into this ?MRI of the brain - will call  ?EEG for brain waves ?Blood work ?Can send for formal neurocognitive testing if needed, will follow up in 6 months (Dr. Cecelia Byars) ? ?Orders Placed This Encounter  ?Procedures  ? MR BRAIN W WO CONTRAST  ? B12 and Folate Panel  ? Methylmalonic acid, serum  ? Vitamin B1  ? RPR  ? Basic Metabolic Panel  ? EEG adult  ? ? ? ?Migraine: rizatriptan: Please take one tablet at the onset of your headache. If it does not improve the symptoms please take one additional tablet. Do not take more then 2 tablets in 24hrs. Do not take use more then 2 to 3 times in a week. May take with ondansetron for nausea and also with aspirin/alleve RIGHT AT onset of headache ? ?There is increased risk for stroke in women with migraine with aura and a contraindication for the combined contraceptive pill for use by women who have migraine with aura. The risk for women with migraine without aura is lower. However other risk factors like smoking are far more likely to increase stroke risk than migraine. There is a recommendation for no smoking and for the use of OCPs without estrogen such as progestogen only pills particularly for women with migraine with aura.Marland Kitchen People who have migraine headaches with auras may be 3 times more likely to have a stroke caused by a blood clot, compared to migraine patients who don't see auras. Women who take hormone-replacement therapy may be 30 percent more likely to suffer a clot-based stroke than women not taking medication containing estrogen. Other risk factors like smoking and high blood pressure may be  much more important. ? ?Ondansetron Dissolving Tablets ?What is this medication? ?ONDANSETRON (on DAN se tron) prevents nausea and vomiting from chemotherapy, radiation, or surgery. It works by blocking substances in the body that may cause nausea or vomiting. It belongs to a group of  medications called antiemetics. ?This medicine may be used for other purposes; ask your health care provider or pharmacist if you have questions. ?COMMON BRAND NAME(S): Zofran ODT ?What should I tell my care team before I take this medication? ?They need to know if you have any of these conditions: ?Heart disease ?History of irregular heartbeat ?Liver disease ?Low levels of magnesium or potassium in the blood ?An unusual or allergic reaction to ondansetron, granisetron, other medications, foods, dyes, or preservatives ?Pregnant or trying to get pregnant ?Breast-feeding ?How should I use this medication? ?These tablets are made to dissolve in the mouth. Do not try to push the tablet through the foil backing. With dry hands, peel away the foil backing and gently remove the tablet. Place the tablet in the mouth and allow it to dissolve, then swallow. While you may take these tablets with water, it is not necessary to do so. ?Talk to your care team regarding the use of this medication in children. Special care may be needed. ?Overdosage: If you think you have taken too much of this medicine contact a poison control center or emergency room at once. ?NOTE: This medicine is only for you. Do not share this medicine with others. ?What if I miss a dose? ?If you miss a dose, take it as soon as you can. If it is almost time for your next dose, take only that dose. Do not take double or extra doses. ?What may interact with this medication? ?  Do not take this medication with any of the following: ?Apomorphine ?Certain medications for fungal infections like fluconazole, itraconazole, ketoconazole, posaconazole, voriconazole ?Cisapride ?Dronedarone ?Pimozide ?Thioridazine ?This medication may also interact with the following: ?Carbamazepine ?Certain medications for depression, anxiety, or psychotic disturbances ?Fentanyl ?Linezolid ?MAOIs like Carbex, Eldepryl, Marplan, Nardil, and Parnate ?Methylene blue (injected into a  vein) ?Other medications that prolong the QT interval (cause an abnormal heart rhythm) like dofetilide, ziprasidone ?Phenytoin ?Rifampicin ?Tramadol ?This list may not describe all possible interactions. Give your health care provider a list of all the medicines, herbs, non-prescription drugs, or dietary supplements you use. Also tell them if you smoke, drink alcohol, or use illegal drugs. Some items may interact with your medicine. ?What should I watch for while using this medication? ?Check with your care team as soon as you can if you have any sign of an allergic reaction. ?What side effects may I notice from receiving this medication? ?Side effects that you should report to your care team as soon as possible: ?Allergic reactions--skin rash, itching, hives, swelling of the face, lips, tongue, or throat ?Bowel blockage--stomach cramping, unable to have a bowel movement or pass gas, loss of appetite, vomiting ?Chest pain (angina)--pain, pressure, or tightness in the chest, neck, back, or arms ?Heart rhythm changes--fast or irregular heartbeat, dizziness, feeling faint or lightheaded, chest pain, trouble breathing ?Irritability, confusion, fast or irregular heartbeat, muscle stiffness, twitching muscles, sweating, high fever, seizure, chills, vomiting, diarrhea, which may be signs of serotonin syndrome ?Side effects that usually do not require medical attention (report to your care team if they continue or are bothersome): ?Constipation ?Diarrhea ?General discomfort and fatigue ?Headache ?This list may not describe all possible side effects. Call your doctor for medical advice about side effects. You may report side effects to FDA at 1-800-FDA-1088. ?Where should I keep my medication? ?Keep out of the reach of children and pets. ?Store between 2 and 30 degrees C (36 and 86 degrees F). Throw away any unused medication after the expiration date. ?NOTE: This sheet is a summary. It may not cover all possible  information. If you have questions about this medicine, talk to your doctor, pharmacist, or health care provider. ?? 2022 Elsevier/Gold Standard (2020-11-15 00:00:00) ? ? ?Rizatriptan Disintegrating Tablets ?What is this medication? ?RIZATRIPTAN (rye za TRIP tan) treats migraines. It works by blocking pain signals and narrowing blood vessels in the brain. It belongs to a group of medications called triptans. It is not used to prevent migraines. ?This medicine may be used for other purposes; ask your health care provider or pharmacist if you have questions. ?COMMON BRAND NAME(S): Maxalt-MLT ?What should I tell my care team before I take this medication? ?They need to know if you have any of these conditions: ?Cigarette smoker ?Circulation problems in fingers and toes ?Diabetes ?Heart disease ?High blood pressure ?High cholesterol ?History of irregular heartbeat ?History of stroke ?Kidney disease ?Liver disease ?Stomach or intestine problems ?An unusual or allergic reaction to rizatriptan, other medications, foods, dyes, or preservatives ?Pregnant or trying to get pregnant ?Breast-feeding ?How should I use this medication? ?Take this medication by mouth. Follow the directions on the prescription label. Leave the tablet in the sealed blister pack until you are ready to take it. With dry hands, open the blister and gently remove the tablet. If the tablet breaks or crumbles, throw it away and take a new tablet out of the blister pack. Place the tablet in the mouth and allow it to dissolve, and  then swallow. Do not cut, crush, or chew this medication. You do not need water to take this medication. Do not take it more often than directed. ?Talk to your care team regarding the use of this medication in children. While this medication may be prescribed for children as young as 6 years for selected conditions, precautions do apply. ?Overdosage: If you think you have taken too much of this medicine contact a poison control  center or emergency room at once. ?NOTE: This medicine is only for you. Do not share this medicine with others. ?What if I miss a dose? ?This does not apply. This medication is not for regular use. ?What may interact with this medica

## 2022-01-31 LAB — BASIC METABOLIC PANEL
BUN/Creatinine Ratio: 13 (ref 9–23)
BUN: 11 mg/dL (ref 6–24)
CO2: 23 mmol/L (ref 20–29)
Calcium: 9.1 mg/dL (ref 8.7–10.2)
Chloride: 105 mmol/L (ref 96–106)
Creatinine, Ser: 0.88 mg/dL (ref 0.57–1.00)
Glucose: 85 mg/dL (ref 70–99)
Potassium: 4.2 mmol/L (ref 3.5–5.2)
Sodium: 142 mmol/L (ref 134–144)
eGFR: 79 mL/min/{1.73_m2} (ref >59)

## 2022-01-31 LAB — B12 AND FOLATE PANEL
Folate: 11.1 ng/mL (ref >3.0)
Vitamin B-12: 351 pg/mL (ref 232–1245)

## 2022-01-31 LAB — RPR: RPR Ser Ql: NONREACTIVE

## 2022-01-31 LAB — METHYLMALONIC ACID, SERUM: Methylmalonic Acid: 89 nmol/L (ref 0–378)

## 2022-01-31 LAB — VITAMIN B1: Thiamine: 80.6 nmol/L (ref 66.5–200.0)

## 2022-02-04 ENCOUNTER — Ambulatory Visit: Payer: 59 | Admitting: Neurology

## 2022-02-04 ENCOUNTER — Telehealth: Payer: Self-pay | Admitting: Neurology

## 2022-02-04 DIAGNOSIS — R4701 Aphasia: Secondary | ICD-10-CM

## 2022-02-04 DIAGNOSIS — Z818 Family history of other mental and behavioral disorders: Secondary | ICD-10-CM

## 2022-02-04 DIAGNOSIS — H547 Unspecified visual loss: Secondary | ICD-10-CM

## 2022-02-04 DIAGNOSIS — R4189 Other symptoms and signs involving cognitive functions and awareness: Secondary | ICD-10-CM

## 2022-02-04 DIAGNOSIS — R519 Headache, unspecified: Secondary | ICD-10-CM

## 2022-02-04 DIAGNOSIS — E519 Thiamine deficiency, unspecified: Secondary | ICD-10-CM

## 2022-02-04 DIAGNOSIS — R413 Other amnesia: Secondary | ICD-10-CM

## 2022-02-04 DIAGNOSIS — E538 Deficiency of other specified B group vitamins: Secondary | ICD-10-CM

## 2022-02-04 NOTE — Telephone Encounter (Signed)
spoke to the patient due to the cost she will call back to schedule  ?[employer] auth: [employer] via [employer] website  ?

## 2022-02-05 NOTE — Telephone Encounter (Signed)
Patient called and wanted to schedule her MRI. She wanted to it in June. She is scheduled for 04/07/22 at [facility].  ?

## 2022-02-06 NOTE — Procedures (Signed)
? ? ?  History: ? ?51 year old woman with cognitive decline ? ?EEG classification: Awake and drowsy ? ?Description of the recording: The background rhythms of this recording consists of a fairly well modulated medium amplitude alpha rhythm of 10 Hz that is reactive to eye opening and closure. As the record progresses, the patient appears to remain in the waking state throughout the recording. Photic stimulation was performed, did not show any abnormalities. Hyperventilation was also performed, did not show any abnormalities. Toward the end of the recording, the patient enters the drowsy state with slight symmetric slowing seen. The patient never enters stage II sleep. No abnormal epileptiform discharges seen during this recording. There was no focal slowing. EKG monitor shows no evidence of cardiac rhythm abnormalities with a heart rate of 60. ? ?Abnormality: None  ? ?Impression: This is a normal EEG recording in the waking and drowsy state. No evidence of interictal epileptiform discharges seen. A normal EEG does not exclude a diagnosis of epilepsy.  ? ? ?Alric Ran, MD ?[facility] ?  ?

## 2022-02-27 ENCOUNTER — Emergency Department (HOSPITAL_BASED_OUTPATIENT_CLINIC_OR_DEPARTMENT_OTHER): Payer: 59

## 2022-02-27 ENCOUNTER — Emergency Department (HOSPITAL_BASED_OUTPATIENT_CLINIC_OR_DEPARTMENT_OTHER)
Admission: EM | Admit: 2022-02-27 | Discharge: 2022-02-27 | Disposition: A | Discharge status: Home / Self Care | Payer: 59 | Attending: Emergency Medicine | Admitting: Emergency Medicine

## 2022-02-27 ENCOUNTER — Other Ambulatory Visit: Payer: Self-pay

## 2022-02-27 ENCOUNTER — Encounter (HOSPITAL_BASED_OUTPATIENT_CLINIC_OR_DEPARTMENT_OTHER): Payer: Self-pay | Admitting: Emergency Medicine

## 2022-02-27 DIAGNOSIS — M25511 Pain in right shoulder: Secondary | ICD-10-CM | POA: Insufficient documentation

## 2022-02-27 DIAGNOSIS — R1900 Intra-abdominal and pelvic swelling, mass and lump, unspecified site: Secondary | ICD-10-CM

## 2022-02-27 DIAGNOSIS — R1084 Generalized abdominal pain: Secondary | ICD-10-CM | POA: Insufficient documentation

## 2022-02-27 DIAGNOSIS — R1909 Other intra-abdominal and pelvic swelling, mass and lump: Secondary | ICD-10-CM | POA: Insufficient documentation

## 2022-02-27 LAB — COMPREHENSIVE METABOLIC PANEL
ALT: 15 U/L (ref 0–44)
AST: 20 U/L (ref 15–41)
Albumin: 4 g/dL (ref 3.5–5.0)
Alkaline Phosphatase: 32 U/L — ABNORMAL LOW (ref 38–126)
Anion gap: 6 (ref 5–15)
BUN: 7 mg/dL (ref 6–20)
CO2: 27 mmol/L (ref 22–32)
Calcium: 9.1 mg/dL (ref 8.9–10.3)
Chloride: 105 mmol/L (ref 98–111)
Creatinine, Ser: 0.8 mg/dL (ref 0.44–1.00)
GFR, Estimated: >60 mL/min (ref >60)
Glucose, Bld: 101 mg/dL — ABNORMAL HIGH (ref 70–99)
Potassium: 3.6 mmol/L (ref 3.5–5.1)
Sodium: 138 mmol/L (ref 135–145)
Total Bilirubin: 0.6 mg/dL (ref 0.3–1.2)
Total Protein: 6.5 g/dL (ref 6.5–8.1)

## 2022-02-27 LAB — CBC WITH DIFFERENTIAL/PLATELET
Abs Immature Granulocytes: 0.01 10*3/uL (ref 0.00–0.07)
Basophils Absolute: 0 10*3/uL (ref 0.0–0.1)
Basophils Relative: 1 %
Eosinophils Absolute: 0.1 10*3/uL (ref 0.0–0.5)
Eosinophils Relative: 1 %
HCT: 38.3 % (ref 36.0–46.0)
Hemoglobin: 12.9 g/dL (ref 12.0–15.0)
Immature Granulocytes: 0 %
Lymphocytes Relative: 37 %
Lymphs Abs: 1.9 10*3/uL (ref 0.7–4.0)
MCH: 28.9 pg (ref 26.0–34.0)
MCHC: 33.7 g/dL (ref 30.0–36.0)
MCV: 85.9 fL (ref 80.0–100.0)
Monocytes Absolute: 0.5 10*3/uL (ref 0.1–1.0)
Monocytes Relative: 10 %
Neutro Abs: 2.6 10*3/uL (ref 1.7–7.7)
Neutrophils Relative %: 51 %
Platelets: 249 10*3/uL (ref 150–400)
RBC: 4.46 MIL/uL (ref 3.87–5.11)
RDW: 12.7 % (ref 11.5–15.5)
WBC: 5.1 10*3/uL (ref 4.0–10.5)
nRBC: 0 % (ref 0.0–0.2)

## 2022-02-27 LAB — LIPASE, BLOOD: Lipase: 29 U/L (ref 11–51)

## 2022-02-27 MED ORDER — IOHEXOL 300 MG/ML  SOLN
100.0000 mL | Freq: Once | INTRAMUSCULAR | Status: AC | PRN
  Administered 2022-02-27: 100 mL via INTRAVENOUS

## 2022-02-27 MED ORDER — SODIUM CHLORIDE 0.9 % IV BOLUS
1000.0000 mL | Freq: Once | INTRAVENOUS | Status: AC
  Administered 2022-02-27: 1000 mL via INTRAVENOUS

## 2022-02-27 NOTE — ED Triage Notes (Signed)
Pt states she had a colonoscopy yesterday and this morning she woke up with pain in her right shoulder  Pt went to work and then started having dizziness  Pt states then she had pain down her right arm and in her right shoulder blade  Pt states this afternoon she started having pain in the right side of her abdomen and now that is shooting across her abdomen and she has had some pain in her right leg  Pt called her dr and they sent her here for evaluation   ?

## 2022-02-27 NOTE — Discharge Instructions (Addendum)
You were seen in the emergency department for right shoulder and right-sided abdominal pain after colonoscopy.  You had lab work and a CAT scan that did not show an obvious explanation for your pain.  There was no evidence of a perforation.  On the CAT scan they did see 2 items in your pelvis that will require follow-up.  Included below is the report of the CAT scan.  For now recommend rest and hydration.  Return to the emergency department if any worsening or concerning symptoms. ? ?IMPRESSION:  ?1. No definite CT evidence for acute intra-abdominal or pelvic  ?abnormality. No evidence for perforation.  ?2. 4.1 cm oval enhancing lesion in the right pelvis posterior to the  ?bladder in this patient with history of hysterectomy. This is  ?indeterminate for adnexal lesion. In addition, there is a 4.3 cm  ?cystic structure within the left anterior pelvis indeterminate for  ?adnexal cystic lesion. Recommend nonemergent pelvic ultrasound for  ?both findings.  ? ?

## 2022-02-27 NOTE — ED Provider Notes (Signed)
?[facility] EMER[facility]ENCY DEPARTMENT ?Provider Note ? ? ?CSN: [id number] ?Arrival date & time: 02/27/22  1908 ? ?  ? ?History ? ?Chief Complaint  ?Patient presents with  ? right side pain   ? ? ?Kim Gonzales is a 51 y.o. female.  She had a routine colonoscopy done yesterday by [facility].  This morning when she woke up she was experiencing some right shoulder pain that radiated down her right arm.  Worse with movement.  Later in the day at work she felt dizzy and needed to go home from work.  She then experienced some right-sided shooting abdominal pain that radiated down her right leg.  She talked to the GI team and they recommended she come to the emergency department.  She still has the right shoulder pain and the abdominal pain although the right leg pain is gone away.  No fevers or chills.  No urinary symptoms.  She tried drinking some fluids without improvement. ? ?The history is provided by the patient.  ?Abdominal Pain ?Pain location:  RUQ and RLQ ?Pain quality: shooting   ?Pain radiates to:  R leg and R shoulder ?Pain severity:  Moderate ?Onset quality:  Gradual ?Duration:  1 day ?Timing:  Constant ?Progression:  Unchanged ?Chronicity:  New ?Relieved by:  Nothing ?Worsened by:  Movement ?Ineffective treatments:  Liquids ?Associated symptoms: no chest pain, no constipation, no cough, no diarrhea, no dysuria, no fever, no nausea, no shortness of breath and no vomiting   ? ?  ? ?Home Medications ?Prior to Admission medications   ?Medication Sig Start Date End Date Taking? Authorizing Provider  ?Ascorbic Acid (VITAMIN C PO) Take 3 tablets by mouth daily.    [provider]  ?Cholecalciferol (VITAMIN D3) 75 MCG (3000 UT) TABS Take 6,000 Units by mouth daily.    [provider]  ?ferrous sulfate 325 (65 FE) MG tablet Take 325 mg by mouth daily.     [provider]  ?ibuprofen (ADVIL) 600 MG tablet take 1 tablet po pc every 6 hours for 5 days then prn-pain 09/29/19   Earnstine Regal,  PA-C  ?ondansetron (ZOFRAN-ODT) 4 MG disintegrating tablet Take 1-2 tablets (4-8 mg total) by mouth every 8 (eight) hours as needed. 01/28/22   Melvenia Beam, MD  ?OZEMPIC, 0.25 OR 0.5 MG/DOSE, 2 MG/1.5ML SOPN SMARTSIG:0.25 Milligram(s) SUB-Q Once a Week 01/08/22   [provider]  ?rizatriptan (MAXALT-MLT) 10 MG disintegrating tablet Take 1 tablet (10 mg total) by mouth as needed for migraine. May repeat in 2 hours if needed 01/28/22   Melvenia Beam, MD  ?Zinc 50 MG TABS Take 100 mg by mouth daily.    [provider]  ?   ? ?Allergies    ?Bactrim [sulfamethoxazole-trimethoprim] and Ciprofloxacin   ? ?Review of Systems   ?Review of Systems  ?Constitutional:  Negative for fever.  ?Respiratory:  Negative for cough and shortness of breath.   ?Cardiovascular:  Negative for chest pain.  ?Gastrointestinal:  Positive for abdominal pain. Negative for constipation, diarrhea, nausea and vomiting.  ?Genitourinary:  Negative for dysuria.  ?Skin:  Negative for rash.  ?Neurological:  Positive for dizziness. Negative for headaches.  ? ?Physical Exam ?Updated Vital Signs ?BP 124/72 (BP Location: Left Arm)   Pulse (!) 57   Temp 97.8 ?F (36.6 ?C) (Oral)   Resp 18   Ht 5' 6.5" (1.689 m)   Wt 77.9 kg   LMP 02/09/2016   SpO2 97%   BMI 27.31 kg/m?  ?  Physical Exam ?Vitals and nursing note reviewed.  ?Constitutional:   ?   General: She is not in acute distress. ?   Appearance: Normal appearance. She is well-developed.  ?HENT:  ?   Head: Normocephalic and atraumatic.  ?Eyes:  ?   Conjunctiva/sclera: Conjunctivae normal.  ?Cardiovascular:  ?   Rate and Rhythm: Normal rate and regular rhythm.  ?   Heart sounds: No murmur heard. ?Pulmonary:  ?   Effort: Pulmonary effort is normal. No respiratory distress.  ?   Breath sounds: Normal breath sounds.  ?Abdominal:  ?   Palpations: Abdomen is soft.  ?   Tenderness: There is no abdominal tenderness. There is no guarding or rebound.  ?Musculoskeletal:     ?   General: No  swelling. Normal range of motion.  ?   Cervical back: Neck supple.  ?Skin: ?   General: Skin is warm and dry.  ?   Capillary Refill: Capillary refill takes less than 2 seconds.  ?Neurological:  ?   General: No focal deficit present.  ?   Mental Status: She is alert.  ? ? ?ED Results / Procedures / Treatments   ?Labs ?(all labs ordered are listed, but only abnormal results are displayed) ?Labs Reviewed  ?COMPREHENSIVE METABOLIC PANEL - Abnormal; Notable for the following components:  ?    Result Value  ? Glucose, Bld 101 (*)   ? Alkaline Phosphatase 32 (*)   ? All other components within normal limits  ?LIPASE, BLOOD  ?CBC WITH DIFFERENTIAL/PLATELET  ? ? ?EKG ?EKG Interpretation ? ?Date/Time:  Friday Feb 27 2022 19:41:28 EDT ?Ventricular Rate:  60 ?PR Interval:  148 ?QRS Duration: 95 ?QT Interval:  447 ?QTC Calculation: 447 ?R Axis:   68 ?Text Interpretation: Sinus rhythm RSR' in V1 or V2, right VCD or RVH No old tracing to compare Confirmed by Aletta Edouard [id number]) on 02/27/2022 7:48:08 PM ? ?Radiology ?CT Abdomen Pelvis W Contrast ? ?Result Date: 02/27/2022 ?CLINICAL DATA:  Right pain after colonoscopy EXAM: CT ABDOMEN AND PELVIS WITH CONTRAST TECHNIQUE: Multidetector CT imaging of the abdomen and pelvis was performed using the standard protocol following bolus administration of intravenous contrast. RADIATION DOSE REDUCTION: This exam was performed according to the departmental dose-optimization program which includes automated exposure control, adjustment of the mA and/or kV according to patient size and/or use of iterative reconstruction technique. CONTRAST:  158m OMNIPAQUE IOHEXOL 300 MG/ML  SOLN COMPARISON:  CT 03/05/2016 FINDINGS: Lower chest: Lung bases demonstrate minimal atelectasis or scarring at the bases. No acute consolidation or effusion. Normal cardiac size. Hepatobiliary: No focal hepatic abnormality. No calcified gallstone. No biliary dilatation Pancreas: Unremarkable. No pancreatic ductal  dilatation or surrounding inflammatory changes. Spleen: Normal in size without focal abnormality. Adrenals/Urinary Tract: Adrenal glands are normal. Kidneys show no hydronephrosis. The bladder is distended Stomach/Bowel: The stomach is nonenlarged. No dilated small bowel. No acute bowel wall thickening. Negative appendix. Vascular/Lymphatic: Nonaneurysmal aorta.  No suspicious lymph node Reproductive: Status post hysterectomy. 4.1 x 2.7 cm enhancing lesion within the right pelvis posterior to the bladder. 4.3 by 3.8 cm cystic structure along the left anterior pelvis. Other: Negative for pelvic effusion or free air. Small fat containing periumbilical hernia Musculoskeletal: No acute or significant osseous findings. IMPRESSION: 1. No definite CT evidence for acute intra-abdominal or pelvic abnormality. No evidence for perforation. 2. 4.1 cm oval enhancing lesion in the right pelvis posterior to the bladder in this patient with history of hysterectomy. This is indeterminate for adnexal lesion. In  addition, there is a 4.3 cm cystic structure within the left anterior pelvis indeterminate for adnexal cystic lesion. Recommend nonemergent pelvic ultrasound for both findings. Electronically Signed   By: Donavan Foil M.D.   On: 02/27/2022 21:23  ? ?DG Chest Port 1 View ? ?Result Date: 02/27/2022 ?CLINICAL DATA:  Shoulder pain EXAM: PORTABLE CHEST 1 VIEW COMPARISON:  Chest x-ray dated Mar 04, 2016 FINDINGS: The heart size and mediastinal contours are within normal limits. Both lungs are clear. The visualized skeletal structures are unremarkable. IMPRESSION: No active disease. Electronically Signed   By: Yetta Glassman M.D.   On: 02/27/2022 19:40   ? ?Procedures ?Procedures  ? ? ?Medications Ordered in ED ?Medications  ?sodium chloride 0.9 % bolus 1,000 mL (0 mLs Intravenous Stopped 02/27/22 2201)  ?iohexol (OMNIPAQUE) 300 MG/ML solution 100 mL (100 mLs Intravenous Contrast Given 02/27/22 2054)  ? ? ?ED Course/ Medical Decision  Making/ A&P ?Clinical Course as of 02/28/22 0848  ?Fri Feb 27, 2022  ?1957 Chest x-ray interpreted by me as no pneumothorax no obvious free air.  Awaiting radiology reading. [MB]  ?2144 Reviewed results of CT and lab work wit

## 2022-04-07 ENCOUNTER — Other Ambulatory Visit: Payer: 59

## 2022-05-12 ENCOUNTER — Other Ambulatory Visit: Payer: Self-pay | Admitting: Family Medicine

## 2022-05-12 DIAGNOSIS — R935 Abnormal findings on diagnostic imaging of other abdominal regions, including retroperitoneum: Secondary | ICD-10-CM

## 2022-05-15 ENCOUNTER — Ambulatory Visit
Admission: RE | Admit: 2022-05-15 | Discharge: 2022-05-15 | Disposition: A | Discharge status: Home / Self Care | Payer: 59 | Source: Ambulatory Visit | Attending: Family Medicine | Admitting: Family Medicine

## 2022-05-15 DIAGNOSIS — R935 Abnormal findings on diagnostic imaging of other abdominal regions, including retroperitoneum: Secondary | ICD-10-CM

## 2022-05-18 ENCOUNTER — Ambulatory Visit
Admission: RE | Admit: 2022-05-18 | Discharge: 2022-05-18 | Disposition: A | Discharge status: Home / Self Care | Payer: 59 | Source: Ambulatory Visit | Attending: Family Medicine | Admitting: Family Medicine

## 2022-05-18 ENCOUNTER — Other Ambulatory Visit: Payer: Self-pay | Admitting: Family Medicine

## 2022-05-18 DIAGNOSIS — R935 Abnormal findings on diagnostic imaging of other abdominal regions, including retroperitoneum: Secondary | ICD-10-CM

## 2022-05-19 ENCOUNTER — Other Ambulatory Visit: Payer: Self-pay | Admitting: Family Medicine

## 2022-05-19 DIAGNOSIS — N9489 Other specified conditions associated with female genital organs and menstrual cycle: Secondary | ICD-10-CM

## 2022-05-20 ENCOUNTER — Ambulatory Visit
Admission: RE | Admit: 2022-05-20 | Discharge: 2022-05-20 | Disposition: A | Discharge status: Home / Self Care | Payer: 59 | Source: Ambulatory Visit | Attending: Family Medicine | Admitting: Family Medicine

## 2022-05-20 DIAGNOSIS — N9489 Other specified conditions associated with female genital organs and menstrual cycle: Secondary | ICD-10-CM

## 2022-05-20 MED ORDER — GADOBENATE DIMEGLUMINE 529 MG/ML IV SOLN
15.0000 mL | Freq: Once | INTRAVENOUS | Status: AC | PRN
  Administered 2022-05-20: 15 mL via INTRAVENOUS

## 2022-05-26 ENCOUNTER — Telehealth: Payer: Self-pay | Admitting: Family Medicine

## 2022-05-26 NOTE — Telephone Encounter (Signed)
LVM informing pt of appointment change due to provider template change

## 2022-05-28 ENCOUNTER — Other Ambulatory Visit: Payer: 59

## 2022-06-16 ENCOUNTER — Other Ambulatory Visit: Payer: Self-pay | Admitting: Obstetrics and Gynecology

## 2022-07-30 ENCOUNTER — Ambulatory Visit: Payer: 59 | Admitting: Family Medicine

## 2022-08-24 NOTE — Pre-Procedure Instructions (Signed)
Surgical Instructions    Your procedure is scheduled on Wednesday, November 8th.  Report to [facility] Main Entrance "A" at 10:15 A.M., then check in with the Admitting office.  Call this number if you have problems the morning of surgery:  [phone]   If you have any questions prior to your surgery date call [phone]: Open Monday-Friday 8am-4pm    Remember:  Do not eat or drink after midnight the night before your surgery     Take these medicines the morning of surgery with A SIP OF WATER: none  As of today, STOP taking any Aspirin (unless otherwise instructed by your surgeon) Aleve, Naproxen, Ibuprofen, Motrin, Advil, Goody's, BC's, all herbal medications, fish oil, and all vitamins.                     Do NOT Smoke (Tobacco/Vaping) for 24 hours prior to your procedure.  If you use a CPAP at night, you may bring your mask/headgear for your overnight stay.   Contacts, glasses, piercing's, hearing aid's, dentures or partials may not be worn into surgery, please bring cases for these belongings.    For patients admitted to the hospital, discharge time will be determined by your treatment team.   Patients discharged the day of surgery will not be allowed to drive home, and someone needs to stay with them for 24 hours.  SURGICAL WAITING ROOM VISITATION Patients having surgery or a procedure may have no more than 2 support people in the waiting area - these visitors may rotate.   Children under the age of 22 must have an adult with them who is not the patient. If the patient needs to stay at the hospital during part of their recovery, the visitor guidelines for inpatient rooms apply. Pre-op nurse will coordinate an appropriate time for 1 support person to accompany patient in pre-op.  This support person may not rotate.   Please refer to the [facility] website for the visitor guidelines for Inpatients (after your surgery is over and you are in a regular room).    Special  instructions:   Kim Gonzales- Preparing For Surgery  Before surgery, you can play an important role. Because skin is not sterile, your skin needs to be as free of germs as possible. You can reduce the number of germs on your skin by washing with CHG (chlorahexidine gluconate) Soap before surgery.  CHG is an antiseptic cleaner which kills germs and bonds with the skin to continue killing germs even after washing.    Oral Hygiene is also important to reduce your risk of infection.  Remember - BRUSH YOUR TEETH THE MORNING OF SURGERY WITH YOUR REGULAR TOOTHPASTE  Please do not use if you have an allergy to CHG or antibacterial soaps. If your skin becomes reddened/irritated stop using the CHG.  Do not shave (including legs and underarms) for at least 48 hours prior to first CHG shower. It is OK to shave your face.  Please follow these instructions carefully.   Shower the NIGHT BEFORE SURGERY and the MORNING OF SURGERY  If you chose to wash your hair, wash your hair first as usual with your normal shampoo.  After you shampoo, rinse your hair and body thoroughly to remove the shampoo.  Use CHG Soap as you would any other liquid soap. You can apply CHG directly to the skin and wash gently with a scrungie or a clean washcloth.   Apply the CHG Soap to your body ONLY FROM  THE NECK DOWN.  Do not use on open wounds or open sores. Avoid contact with your eyes, ears, mouth and genitals (private parts). Wash Face and genitals (private parts)  with your normal soap.   Wash thoroughly, paying special attention to the area where your surgery will be performed.  Thoroughly rinse your body with warm water from the neck down.  DO NOT shower/wash with your normal soap after using and rinsing off the CHG Soap.  Pat yourself dry with a CLEAN TOWEL.  Wear CLEAN PAJAMAS to bed the night before surgery  Place CLEAN SHEETS on your bed the night before your surgery  DO NOT SLEEP WITH PETS.   Day of  Surgery: Take a shower with CHG soap. Do not wear jewelry or makeup Do not wear lotions, powders, perfumes, or deodorant. Do not shave 48 hours prior to surgery.   Do not bring valuables to the hospital. [facility] is not responsible for any belongings or valuables. Do not wear nail polish, gel polish, artificial nails, or any other type of covering on natural nails (fingers and toes) If you have artificial nails or gel coating that need to be removed by a nail salon, please have this removed prior to surgery. Artificial nails or gel coating may interfere with anesthesia's ability to adequately monitor your vital signs. Wear Clean/Comfortable clothing the morning of surgery Remember to brush your teeth WITH YOUR REGULAR TOOTHPASTE.   Please read over the following fact sheets that you were given.    If you received a COVID test during your pre-op visit  it is requested that you wear a mask when out in public, stay away from anyone that may not be feeling well and notify your surgeon if you develop symptoms. If you have been in contact with anyone that has tested positive in the last 10 days please notify you surgeon.

## 2022-08-25 ENCOUNTER — Other Ambulatory Visit: Payer: Self-pay

## 2022-08-25 ENCOUNTER — Encounter (HOSPITAL_COMMUNITY)
Admission: RE | Admit: 2022-08-25 | Discharge: 2022-08-25 | Disposition: A | Discharge status: Home / Self Care | Payer: 59 | Source: Ambulatory Visit | Attending: Obstetrics and Gynecology | Admitting: Obstetrics and Gynecology

## 2022-08-25 ENCOUNTER — Encounter (HOSPITAL_COMMUNITY): Payer: Self-pay

## 2022-08-25 VITALS — BP 109/63 | HR 69 | Temp 97.8°F | Resp 16 | Ht 66.5 in | Wt 166.2 lb

## 2022-08-25 DIAGNOSIS — Z01818 Encounter for other preprocedural examination: Secondary | ICD-10-CM | POA: Diagnosis not present

## 2022-08-25 HISTORY — DX: Headache, unspecified: R51.9

## 2022-08-25 HISTORY — DX: Pneumonia, unspecified organism: J18.9

## 2022-08-25 LAB — COMPREHENSIVE METABOLIC PANEL
ALT: 17 U/L (ref 0–44)
AST: 24 U/L (ref 15–41)
Albumin: 4 g/dL (ref 3.5–5.0)
Alkaline Phosphatase: 33 U/L — ABNORMAL LOW (ref 38–126)
Anion gap: 6 (ref 5–15)
BUN: 9 mg/dL (ref 6–20)
CO2: 26 mmol/L (ref 22–32)
Calcium: 9.7 mg/dL (ref 8.9–10.3)
Chloride: 106 mmol/L (ref 98–111)
Creatinine, Ser: 1.02 mg/dL — ABNORMAL HIGH (ref 0.44–1.00)
GFR, Estimated: >60 mL/min (ref >60)
Glucose, Bld: 97 mg/dL (ref 70–99)
Potassium: 5 mmol/L (ref 3.5–5.1)
Sodium: 138 mmol/L (ref 135–145)
Total Bilirubin: 0.6 mg/dL (ref 0.3–1.2)
Total Protein: 6.4 g/dL — ABNORMAL LOW (ref 6.5–8.1)

## 2022-08-25 LAB — CBC
HCT: 41 % (ref 36.0–46.0)
Hemoglobin: 13.6 g/dL (ref 12.0–15.0)
MCH: 29.4 pg (ref 26.0–34.0)
MCHC: 33.2 g/dL (ref 30.0–36.0)
MCV: 88.7 fL (ref 80.0–100.0)
Platelets: 257 10*3/uL (ref 150–400)
RBC: 4.62 MIL/uL (ref 3.87–5.11)
RDW: 13 % (ref 11.5–15.5)
WBC: 4.9 10*3/uL (ref 4.0–10.5)
nRBC: 0 % (ref 0.0–0.2)

## 2022-08-25 NOTE — Progress Notes (Signed)
[facility] Cardi[facility] - denies  PPM/ICD - n/a Device Orders - n/a Rep Notified - n/a  Chest x-ray - 02/27/22 EKG - 02/27/22 Stress Test - Over 15 years ago in [place]. Normal per patient ECHO - denies Cardiac Cath - denies  Sleep Study - denies CPAP - n/a  Fasting Blood Sugar - n/a Checks Blood Sugar _____ times a day- n/a  Last dose of GLP1 agonist-  n/a GLP1 instructions: n/a  Blood Thinner Instructions: n/a Aspirin Instructions: n/a  ERAS Protcol - NPO PRE-SURGERY Ensure or G2- n/a  COVID TEST- N/A   Anesthesia review: No  Patient denies shortness of breath, fever, cough and chest pain at PAT appointment   All instructions explained to the patient, with a verbal understanding of the material. Patient agrees to go over the instructions while at home for a better understanding. The opportunity to ask questions was provided.

## 2022-08-26 LAB — TYPE AND SCREEN
ABO/RH(D): A POS
Antibody Screen: NEGATIVE

## 2022-09-02 ENCOUNTER — Other Ambulatory Visit: Payer: Self-pay

## 2022-09-02 ENCOUNTER — Encounter (HOSPITAL_COMMUNITY): Payer: Self-pay | Admitting: Obstetrics and Gynecology

## 2022-09-02 ENCOUNTER — Encounter (HOSPITAL_COMMUNITY)
Admission: RE | Disposition: A | Discharge status: Home / Self Care | Payer: Self-pay | Source: Home / Self Care | Attending: Obstetrics and Gynecology

## 2022-09-02 ENCOUNTER — Ambulatory Visit (HOSPITAL_COMMUNITY): Payer: 59 | Admitting: Anesthesiology

## 2022-09-02 ENCOUNTER — Ambulatory Visit (HOSPITAL_COMMUNITY)
Admission: RE | Admit: 2022-09-02 | Discharge: 2022-09-02 | Disposition: A | Discharge status: Home / Self Care | Payer: 59 | Attending: Obstetrics and Gynecology | Admitting: Obstetrics and Gynecology

## 2022-09-02 ENCOUNTER — Ambulatory Visit (HOSPITAL_BASED_OUTPATIENT_CLINIC_OR_DEPARTMENT_OTHER): Payer: 59 | Admitting: Anesthesiology

## 2022-09-02 DIAGNOSIS — N83201 Unspecified ovarian cyst, right side: Secondary | ICD-10-CM | POA: Insufficient documentation

## 2022-09-02 DIAGNOSIS — N83202 Unspecified ovarian cyst, left side: Secondary | ICD-10-CM | POA: Diagnosis not present

## 2022-09-02 DIAGNOSIS — N3289 Other specified disorders of bladder: Secondary | ICD-10-CM

## 2022-09-02 HISTORY — PX: LAPAROSCOPIC SALPINGO OOPHERECTOMY: SHX5927

## 2022-09-02 HISTORY — PX: LAPAROTOMY: SHX154

## 2022-09-02 SURGERY — SALPINGO-OOPHORECTOMY, LAPAROSCOPIC
Anesthesia: Regional | Site: Abdomen

## 2022-09-02 MED ORDER — HYDROMORPHONE HCL 1 MG/ML IJ SOLN
0.2500 mg | INTRAMUSCULAR | Status: DC | PRN
  Administered 2022-09-02: 0.5 mg via INTRAVENOUS

## 2022-09-02 MED ORDER — CEFAZOLIN SODIUM-DEXTROSE 2-4 GM/100ML-% IV SOLN
2.0000 g | INTRAVENOUS | Status: AC
  Administered 2022-09-02: 2 g via INTRAVENOUS

## 2022-09-02 MED ORDER — BUPIVACAINE HCL 0.25 % IJ SOLN
INTRAMUSCULAR | Status: DC | PRN
  Administered 2022-09-02: 12 mL

## 2022-09-02 MED ORDER — LACTATED RINGERS IV SOLN: INTRAVENOUS | Status: DC

## 2022-09-02 MED ORDER — KETOROLAC TROMETHAMINE 30 MG/ML IJ SOLN
INTRAMUSCULAR | Status: DC | PRN
  Administered 2022-09-02: 30 mg via INTRAVENOUS

## 2022-09-02 MED ORDER — CHLORHEXIDINE GLUCONATE 0.12 % MT SOLN
OROMUCOSAL | Status: AC
  Administered 2022-09-02: 15 mL via OROMUCOSAL
  Filled 2022-09-02: qty 15

## 2022-09-02 MED ORDER — ONDANSETRON HCL 4 MG/2ML IJ SOLN: 4.0000 mg | Freq: Once | INTRAMUSCULAR | Status: DC | PRN

## 2022-09-02 MED ORDER — DEXAMETHASONE SODIUM PHOSPHATE 10 MG/ML IJ SOLN
INTRAMUSCULAR | Status: DC | PRN
  Administered 2022-09-02: 10 mg via INTRAVENOUS

## 2022-09-02 MED ORDER — HEMOSTATIC AGENTS (NO CHARGE) OPTIME
TOPICAL | Status: DC | PRN
  Administered 2022-09-02: 1 via TOPICAL

## 2022-09-02 MED ORDER — OXYCODONE HCL 5 MG PO TABS
5.0000 mg | ORAL_TABLET | Freq: Once | ORAL | Status: AC | PRN
  Administered 2022-09-02: 5 mg via ORAL

## 2022-09-02 MED ORDER — SCOPOLAMINE 1 MG/3DAYS TD PT72: 1.0000 | MEDICATED_PATCH | TRANSDERMAL | Status: DC

## 2022-09-02 MED ORDER — MIDAZOLAM HCL 2 MG/2ML IJ SOLN
INTRAMUSCULAR | Status: DC | PRN
  Administered 2022-09-02: 2 mg via INTRAVENOUS

## 2022-09-02 MED ORDER — HYDROCODONE-ACETAMINOPHEN 5-325 MG PO TABS: 1.0000 | ORAL_TABLET | Freq: Four times a day (QID) | ORAL | 0 refills | Status: AC | PRN

## 2022-09-02 MED ORDER — ONDANSETRON HCL 4 MG/2ML IJ SOLN
INTRAMUSCULAR | Status: AC
  Filled 2022-09-02: qty 2

## 2022-09-02 MED ORDER — PROPOFOL 10 MG/ML IV BOLUS
INTRAVENOUS | Status: AC
  Filled 2022-09-02: qty 20

## 2022-09-02 MED ORDER — POVIDONE-IODINE 10 % EX SWAB
2.0000 | Freq: Once | CUTANEOUS | Status: AC
  Administered 2022-09-02: 2 via TOPICAL

## 2022-09-02 MED ORDER — ONDANSETRON HCL 4 MG/2ML IJ SOLN
INTRAMUSCULAR | Status: DC | PRN
  Administered 2022-09-02: 4 mg via INTRAVENOUS

## 2022-09-02 MED ORDER — AMISULPRIDE (ANTIEMETIC) 5 MG/2ML IV SOLN: 10.0000 mg | Freq: Once | INTRAVENOUS | Status: DC | PRN

## 2022-09-02 MED ORDER — LACTATED RINGERS IV SOLN: INTRAVENOUS | Status: DC | PRN

## 2022-09-02 MED ORDER — OXYCODONE HCL 5 MG/5ML PO SOLN: 5.0000 mg | Freq: Once | ORAL | Status: AC | PRN

## 2022-09-02 MED ORDER — ACETAMINOPHEN 500 MG PO TABS
ORAL_TABLET | ORAL | Status: AC
  Administered 2022-09-02: 1000 mg via ORAL
  Filled 2022-09-02: qty 2

## 2022-09-02 MED ORDER — ROCURONIUM BROMIDE 10 MG/ML (PF) SYRINGE
PREFILLED_SYRINGE | INTRAVENOUS | Status: DC | PRN
  Administered 2022-09-02: 100 mg via INTRAVENOUS
  Administered 2022-09-02: 20 mg via INTRAVENOUS

## 2022-09-02 MED ORDER — HYDROMORPHONE HCL 1 MG/ML IJ SOLN
INTRAMUSCULAR | Status: AC
  Filled 2022-09-02: qty 1

## 2022-09-02 MED ORDER — 0.9 % SODIUM CHLORIDE (POUR BTL) OPTIME
TOPICAL | Status: DC | PRN
  Administered 2022-09-02: 1000 mL

## 2022-09-02 MED ORDER — FENTANYL CITRATE (PF) 250 MCG/5ML IJ SOLN
INTRAMUSCULAR | Status: DC | PRN
  Administered 2022-09-02: 50 ug via INTRAVENOUS

## 2022-09-02 MED ORDER — ROCURONIUM BROMIDE 10 MG/ML (PF) SYRINGE
PREFILLED_SYRINGE | INTRAVENOUS | Status: AC
  Filled 2022-09-02: qty 10

## 2022-09-02 MED ORDER — DEXAMETHASONE SODIUM PHOSPHATE 10 MG/ML IJ SOLN
INTRAMUSCULAR | Status: AC
  Filled 2022-09-02: qty 1

## 2022-09-02 MED ORDER — MIDAZOLAM HCL 2 MG/2ML IJ SOLN
INTRAMUSCULAR | Status: AC
  Filled 2022-09-02: qty 2

## 2022-09-02 MED ORDER — CEFAZOLIN SODIUM-DEXTROSE 2-4 GM/100ML-% IV SOLN
INTRAVENOUS | Status: AC
  Filled 2022-09-02: qty 100

## 2022-09-02 MED ORDER — KETOROLAC TROMETHAMINE 30 MG/ML IJ SOLN: 30.0000 mg | Freq: Once | INTRAMUSCULAR | Status: DC | PRN

## 2022-09-02 MED ORDER — ACETAMINOPHEN 500 MG PO TABS: 1000.0000 mg | ORAL_TABLET | Freq: Once | ORAL | Status: AC

## 2022-09-02 MED ORDER — SUGAMMADEX SODIUM 200 MG/2ML IV SOLN
INTRAVENOUS | Status: DC | PRN
  Administered 2022-09-02: 300 mg via INTRAVENOUS

## 2022-09-02 MED ORDER — SODIUM CHLORIDE 0.9 % IR SOLN
Status: DC | PRN
  Administered 2022-09-02: 1000 mL

## 2022-09-02 MED ORDER — MEPERIDINE HCL 25 MG/ML IJ SOLN
6.2500 mg | INTRAMUSCULAR | Status: DC | PRN
  Administered 2022-09-02: 6.25 mg via INTRAVENOUS

## 2022-09-02 MED ORDER — MEPERIDINE HCL 25 MG/ML IJ SOLN
INTRAMUSCULAR | Status: AC
  Filled 2022-09-02: qty 1

## 2022-09-02 MED ORDER — OXYCODONE HCL 5 MG PO TABS
ORAL_TABLET | ORAL | Status: AC
  Filled 2022-09-02: qty 1

## 2022-09-02 MED ORDER — BUPIVACAINE HCL (PF) 0.25 % IJ SOLN
INTRAMUSCULAR | Status: AC
  Filled 2022-09-02: qty 30

## 2022-09-02 MED ORDER — LIDOCAINE 2% (20 MG/ML) 5 ML SYRINGE
INTRAMUSCULAR | Status: DC | PRN
  Administered 2022-09-02: 60 mg via INTRAVENOUS

## 2022-09-02 MED ORDER — PROPOFOL 10 MG/ML IV BOLUS
INTRAVENOUS | Status: DC | PRN
  Administered 2022-09-02: 150 mg via INTRAVENOUS

## 2022-09-02 MED ORDER — CHLORHEXIDINE GLUCONATE 0.12 % MT SOLN: 15.0000 mL | Freq: Once | OROMUCOSAL | Status: AC

## 2022-09-02 MED ORDER — LIDOCAINE 2% (20 MG/ML) 5 ML SYRINGE
INTRAMUSCULAR | Status: AC
  Filled 2022-09-02: qty 5

## 2022-09-02 MED ORDER — SCOPOLAMINE 1 MG/3DAYS TD PT72
MEDICATED_PATCH | TRANSDERMAL | Status: AC
  Administered 2022-09-02: 1.5 mg via TRANSDERMAL
  Filled 2022-09-02: qty 1

## 2022-09-02 MED ORDER — ORAL CARE MOUTH RINSE: 15.0000 mL | Freq: Once | OROMUCOSAL | Status: AC

## 2022-09-02 MED ORDER — FENTANYL CITRATE (PF) 250 MCG/5ML IJ SOLN
INTRAMUSCULAR | Status: AC
  Filled 2022-09-02: qty 5

## 2022-09-02 SURGICAL SUPPLY — 59 items
APPLICATOR ARISTA FLEXITIP XL (MISCELLANEOUS) IMPLANT
BAG COUNTER SPONGE SURGICOUNT (BAG) ×1 IMPLANT
BARRIER ADHS 3X4 INTERCEED (GAUZE/BANDAGES/DRESSINGS) IMPLANT
BLADE SURG 10 STRL SS (BLADE) ×2 IMPLANT
CABLE HIGH FREQUENCY MONO STRZ (ELECTRODE) IMPLANT
CANISTER SUCT 3000ML PPV (MISCELLANEOUS) ×1 IMPLANT
CNTNR URN SCR LID CUP LEK RST (MISCELLANEOUS) IMPLANT
CONT SPEC 4OZ STRL OR WHT (MISCELLANEOUS) ×1
DERMABOND ADVANCED .7 DNX12 (GAUZE/BANDAGES/DRESSINGS) ×1 IMPLANT
DRAPE WARM FLUID 44X44 (DRAPES) IMPLANT
DRSG OPSITE POSTOP 3X4 (GAUZE/BANDAGES/DRESSINGS) IMPLANT
DURAPREP 26ML APPLICATOR (WOUND CARE) ×1 IMPLANT
ELECT CAUTERY BLADE 6.4 (BLADE) IMPLANT
FORCEPS CUTTING 33CM 5MM (CUTTING FORCEPS) IMPLANT
FORCEPS CUTTING 45CM 5MM (CUTTING FORCEPS) IMPLANT
GAUZE 4X4 16PLY ~~LOC~~+RFID DBL (SPONGE) IMPLANT
GAUZE VASELINE 3X9 (GAUZE/BANDAGES/DRESSINGS) IMPLANT
GLOVE BIO SURGEON STRL SZ 6.5 (GLOVE) ×1 IMPLANT
GLOVE BIOGEL PI IND STRL 7.0 (GLOVE) ×3 IMPLANT
GOWN STRL REUS W/ TWL LRG LVL3 (GOWN DISPOSABLE) ×2 IMPLANT
GOWN STRL REUS W/TWL LRG LVL3 (GOWN DISPOSABLE) ×3
HEMOSTAT ARISTA ABSORB 3G PWDR (HEMOSTASIS) IMPLANT
HIBICLENS CHG 4% 4OZ BTL (MISCELLANEOUS) ×1 IMPLANT
IRRIG SUCT STRYKERFLOW 2 WTIP (MISCELLANEOUS)
IRRIGATION SUCT STRKRFLW 2 WTP (MISCELLANEOUS) IMPLANT
KIT TURNOVER KIT B (KITS) ×1 IMPLANT
MANIPULATOR UTERINE 4.5 ZUMI (MISCELLANEOUS) IMPLANT
NEEDLE HYPO 22GX1.5 SAFETY (NEEDLE) IMPLANT
NS IRRIG 1000ML POUR BTL (IV SOLUTION) ×1 IMPLANT
PACK ABDOMINAL GYN (CUSTOM PROCEDURE TRAY) ×1 IMPLANT
PACK LAPAROSCOPY BASIN (CUSTOM PROCEDURE TRAY) ×1 IMPLANT
PACK TRENDGUARD 450 HYBRID PRO (MISCELLANEOUS) IMPLANT
PAD OB MATERNITY 4.3X12.25 (PERSONAL CARE ITEMS) ×1 IMPLANT
PENCIL BUTTON HOLSTER BLD 10FT (ELECTRODE) ×1 IMPLANT
POUCH SPECIMEN RETRIEVAL 10MM (ENDOMECHANICALS) IMPLANT
PROTECTOR NERVE ULNAR (MISCELLANEOUS) ×2 IMPLANT
SHEARS HARMONIC ACE PLUS 36CM (ENDOMECHANICALS) IMPLANT
SLEEVE XCEL OPT CAN 5 100 (ENDOMECHANICALS) ×1 IMPLANT
SOLUTION ELECTROLUBE (MISCELLANEOUS) IMPLANT
SPIKE FLUID TRANSFER (MISCELLANEOUS) IMPLANT
SPONGE T-LAP 18X18 ~~LOC~~+RFID (SPONGE) ×2 IMPLANT
STAPLER VISISTAT 35W (STAPLE) ×1 IMPLANT
SUT CHROMIC 2 0 CT 1 (SUTURE) ×1 IMPLANT
SUT MNCRL AB 3-0 PS2 27 (SUTURE) ×1 IMPLANT
SUT PDS AB 0 CT 36 (SUTURE) IMPLANT
SUT PLAIN 2 0 XLH (SUTURE) IMPLANT
SUT VIC AB 0 CT1 27 (SUTURE)
SUT VIC AB 0 CT1 27XBRD ANBCTR (SUTURE) ×2 IMPLANT
SUT VICRYL 0 ENDOLOOP (SUTURE) IMPLANT
SUT VICRYL 0 TIES 12 18 (SUTURE) ×1 IMPLANT
SUT VICRYL 0 UR6 27IN ABS (SUTURE) ×2 IMPLANT
SYR 50ML LL SCALE MARK (SYRINGE) IMPLANT
SYR CONTROL 10ML LL (SYRINGE) IMPLANT
TOWEL GREEN STERILE FF (TOWEL DISPOSABLE) ×2 IMPLANT
TRAY FOLEY W/BAG SLVR 14FR (SET/KITS/TRAYS/PACK) ×1 IMPLANT
TRENDGUARD 450 HYBRID PRO PACK (MISCELLANEOUS)
TROCAR BALLN 12MMX100 BLUNT (TROCAR) ×1 IMPLANT
TUBING EVAC SMOKE HEATED PNEUM (TUBING) ×1 IMPLANT
WARMER LAPAROSCOPE (MISCELLANEOUS) ×1 IMPLANT

## 2022-09-02 NOTE — Transfer of Care (Signed)
Immediate Anesthesia Transfer of Care Note  Patient: Kim Gonzales  Procedure(s) Performed: LAPAROSCOPIC SALPINGO OOPHORECTOMY (Abdomen) POSSIBLE EXPLORATORY LAPAROTOMY (Abdomen)  Patient Location: PACU  Anesthesia Type:General  Level of Consciousness: awake, alert , oriented, and responds to stimulation  Airway & Oxygen Therapy: Patient Spontanous Breathing and Patient connected to nasal cannula oxygen  Post-op Assessment: Report given to RN, Post -op Vital signs reviewed and stable, and Patient moving all extremities X 4  Post vital signs: Reviewed and stable  Last Vitals:  Vitals Value Taken Time  BP    Temp    Pulse    Resp    SpO2      Last Pain:  Vitals:   09/02/22 1044  TempSrc:   PainSc: 6          Complications: No notable events documented.

## 2022-09-02 NOTE — Anesthesia Procedure Notes (Signed)
Procedure Name: Intubation Date/Time: 09/02/2022 12:35 PM  Performed by: Michele Rockers, CRNAPre-anesthesia Checklist: Patient identified, Patient being monitored, Timeout performed, Emergency Drugs available and Suction available Patient Re-evaluated:Patient Re-evaluated prior to induction Oxygen Delivery Method: Circle System Utilized Preoxygenation: Pre-oxygenation with 100% oxygen Induction Type: IV induction Ventilation: Mask ventilation without difficulty Laryngoscope Size: Miller and 2 Grade View: Grade I Tube type: Oral Tube size: 7.0 mm Number of attempts: 1 Airway Equipment and Method: Stylet Placement Confirmation: ETT inserted through vocal cords under direct vision, positive ETCO2 and breath sounds checked- equal and bilateral Secured at: 21 cm Tube secured with: Tape Dental Injury: Teeth and Oropharynx as per pre-operative assessment

## 2022-09-02 NOTE — Op Note (Signed)
agnostic Laparoscopy Procedure Note  Indications: The patient is a 51 y.o. female with pelvic pain and ovarian cyst   Pre-operative Diagnosis: pelvic pain and ovarian cysts  Post-operative Diagnosis: same  Surgeon: DGLOVFI,EPPIR A   Assistants: Dr Waymon Amato  Anesthesia: General endotracheal anesthesia and Local anesthesia .25 % marcaine  ASA Class: per anesthesia  Procedure Details  The patient was seen in the Holding Room. The risks, benefits, complications, treatment options, and expected outcomes were discussed with the patient. The possibilities of reaction to medication, pulmonary aspiration, perforation of viscus, bleeding, recurrent infection, the need for additional procedures, failure to diagnose a condition, and creating a complication requiring transfusion or operation were discussed with the patient. The patient concurred with the proposed plan, giving informed consent. The patient was taken to the Operating Room, identified as Kim Gonzales and the procedure verified as Diagnostic Laparoscopy BSO . A Time Out was held and the above information confirmed.  After induction of general anesthesia, the patient was placed in modified dorsal lithotomy position where she was prepped, draped, and catheterized in the normal, sterile fashion. . A 1 cm umbilical incision was then performed. And carried down to the fascia.  Fascia was incised with a knife.  The peritoneum was entered with a hemostat. The fascial incision was was extended with mayo scissors.  A circumferential suture was placed around the fascia and anchored to the hasson once placed in the abdomen.  The patient had normal appearing liver, and stomach and appedix.  The ovaries were adherant to the side walls.  Two five millimeter trocars placed in the right and left lower quadrant of the abdomen.  Both ovaries were freed lysing the adhesions with scissors.  Endoloops were placed around both IP ligaments after the ureters were  visualized.  The ovaries were removed using the gyrus cautery..  hemostasis was assured. Irrigation was done.   Arista was placed along the side wall were there was oozing from the lysis of adhesions.   Both 5 mm trocars were removed under visualization.   Following the procedure the umbilical hasson was removed after intra-abdominal carbon dioxide was expressed.the purse string was tied and fascia closed.   The incision was closed with subcutaneous and subcuticular sutures of monocryl.   The incisions closed with dermabond.    Instrument, sponge, and needle counts were correct prior to abdominal closure and at the conclusion of the case.   Findings: The anterior hard cystic mass enveloped in the bladder The uterus absent The adnexa normal Cul-de-sac normal  Estimated Blood Loss:  Minimal         Drains: none         Total IV Fluids: 1169m         Specimens: b ovaries           Complications:  None; patient tolerated the procedure well.         Disposition: PACU - hemodynamically stable.         Condition: stable

## 2022-09-02 NOTE — Anesthesia Postprocedure Evaluation (Signed)
Anesthesia Post Note  Patient: Kim Gonzales  Procedure(s) Performed: LAPAROSCOPIC SALPINGO OOPHORECTOMY (Abdomen) POSSIBLE EXPLORATORY LAPAROTOMY (Abdomen)     Patient location during evaluation: PACU Anesthesia Type: General Level of consciousness: awake and alert Pain management: pain level controlled Vital Signs Assessment: post-procedure vital signs reviewed and stable Respiratory status: spontaneous breathing, nonlabored ventilation, respiratory function stable and patient connected to nasal cannula oxygen Cardiovascular status: blood pressure returned to baseline and stable Postop Assessment: no apparent nausea or vomiting Anesthetic complications: no  No notable events documented.  Last Vitals:  Vitals:   09/02/22 1500 09/02/22 1515  BP: 121/68 120/71  Pulse: 66 64  Resp: 12 12  Temp:    SpO2: 100% 100%    Last Pain:  Vitals:   09/02/22 1441  TempSrc:   PainSc: Asleep                 [name]

## 2022-09-02 NOTE — Anesthesia Preprocedure Evaluation (Addendum)
Anesthesia Evaluation  Patient identified by MRN, date of birth, ID band Patient awake    Reviewed: Allergy & Precautions, NPO status , Patient's Chart, lab work & pertinent test results  Airway Mallampati: III  TM Distance: >3 FB Neck ROM: Full    Dental  (+) Teeth Intact, Dental Advisory Given   Pulmonary neg pulmonary ROS   Pulmonary exam normal breath sounds clear to auscultation       Cardiovascular negative cardio ROS Normal cardiovascular exam Rhythm:Regular Rate:Normal     Neuro/Psych  Headaches PSYCHIATRIC DISORDERS  Depression       GI/Hepatic negative GI ROS, Neg liver ROS,,,  Endo/Other  negative endocrine ROS    Renal/GU negative Renal ROS  negative genitourinary   Musculoskeletal negative musculoskeletal ROS (+)    Abdominal   Peds  Hematology  (+) Blood dyscrasia, anemia Hb 13.6   Anesthesia Other Findings   Reproductive/Obstetrics Ovarian cyst                              Anesthesia Physical Anesthesia Plan  ASA: 1  Anesthesia Plan: General and Regional   Post-op Pain Management: Tylenol PO (pre-op)*, Toradol IV (intra-op)*, Precedex, Ketamine IV*, Dilaudid IV and Regional block*   Induction: Intravenous  PONV Risk Score and Plan: 4 or greater and Ondansetron, Dexamethasone, Midazolam, Scopolamine patch - Pre-op and Treatment may vary due to age or medical condition  Airway Management Planned: Oral ETT  Additional Equipment: None  Intra-op Plan:   Post-operative Plan: Extubation in OR  Informed Consent: I have reviewed the patients History and Physical, chart, labs and discussed the procedure including the risks, benefits and alternatives for the proposed anesthesia with the patient or authorized representative who has indicated his/her understanding and acceptance.     Dental advisory given  Plan Discussed with: CRNA  Anesthesia Plan Comments: (Discussed  with pt TAP block in the event of an open procedure (converted from laparoscopic to open last time she had surgery d/t adhesions))       Anesthesia Quick Evaluation

## 2022-09-02 NOTE — H&P (Signed)
Shaneese Tait is an 51 y.o. female. BSO.   P t has had pelvic pain over the last year.   It became worse over the summer.  CT scan was sig for a 4 cm adnexal mass.  No other abnormalities were seen. The pain was made less with tylenol or motrin but never went away.  No n/v/f/c.   No change in bowel habits  Pertinent Gynecological History: Menses:  s/p hysterectomy Bleeding: na Contraception: tubal ligation Sexually transmitted diseases: no past history Previous GYN Procedures:  TAH   Last mammogram: normal Date: 07/2022 Last pap: normal Date: 10/23 OB History: G3, P3   Menstrual History: Menarche age: 89 Patient's last menstrual period was 02/09/2016.    Past Medical History:  Diagnosis Date   Anemia    Depression    Dyspareunia in female 03/26/2016   ESBL E. coli carrier 05/20/2016   Headache    Migraines   Lightheadedness 03/26/2016   Myalgia 03/26/2016   Pneumonia    Vaginal pruritus 03/26/2016    Past Surgical History:  Procedure Laterality Date   ABDOMINAL HYSTERECTOMY     CESAREAN SECTION     x3   compound fracture Right    Compound Fracture Right arm   CYSTOSCOPY N/A 09/27/2019   Procedure: CYSTOSCOPY;  Surgeon: Crawford Givens, MD;  Location: [place];  Service: Gynecology;  Laterality: N/A;   DILATION AND CURETTAGE OF UTERUS     DILITATION & CURRETTAGE/HYSTROSCOPY WITH HYDROTHERMAL ABLATION N/A 10/10/2015   Procedure: DILATATION & CURETTAGE/HYSTEROSCOPY WITH HYDROTHERMAL ABLATION;  Surgeon: Crawford Givens, MD;  Location: [facility];  Service: Gynecology;  Laterality: N/A;   KNEE ARTHROSCOPY     LAPAROSCOPIC VAGINAL HYSTERECTOMY WITH SALPINGECTOMY Bilateral 09/27/2019   Procedure: LAPAROSCOPIC CONVERTED TO OPEN ABDOMINAL HYSTERECTOMY WITH  LEFT SALPINGECTOMY AND EXTENSIVE LYSIS OF ADHESIONS;  Surgeon: Crawford Givens, MD;  Location: [place];  Service: Gynecology;  Laterality: Bilateral;   SHOULDER ARTHROSCOPY     TUBAL LIGATION      pptl   WISDOM TOOTH EXTRACTION      Family History  Problem Relation Age of Onset   Alzheimer's disease Mother    Alzheimer's disease Maternal Grandfather    Kidney nephrosis Neg Hx     Social History:  reports that she has never smoked. She has never used smokeless tobacco. She reports that she does not drink alcohol and does not use drugs.  Allergies:  Allergies  Allergen Reactions   Bactrim [Sulfamethoxazole-Trimethoprim] Other (See Comments)    ? Of bactrim induced neutropenia. Not clear this was the cause though.   Ciprofloxacin Other (See Comments)    Facial loss of freckles.    No medications prior to admission.    ROS  Last menstrual period 02/09/2016. Physical Exam Physical Examination: General appearance - alert, well appearing, and in no distress Chest - clear to auscultation, no wheezes, rales or rhonchi, symmetric air entry Heart - normal rate and regular rhythm Abdomen - soft, nontender, nondistended, no masses or organomegaly Pelvic - VULVA: normal appearing vulva with no masses, tenderness or lesions, VAGINA: normal appearing vagina with normal color and discharge, no lesions, CERVIX: normal appearing cervix without discharge or lesions, surgically absent, UTERUS: surgically absent, vaginal cuff well healed, ADNEXA: normal adnexa in size, nontender and no masses, tenderness bilateral Rectal - normal rectal, no masses Extremities - Homan's sign negative bilaterally   No results found for this or any previous visit (from the past 24 hour(s)).  No results  found.  Assessment/Plan: PEVIC PAIN WITH ADNEXAL CYSTS OVA 1 LOW RISK PT DESIRES BSO   SHE HAD SEVERAL ADHESIONS DURING HER HYSTERECTOMY.  IT CONVERTED TO AN EX LAP WILL ATTEMPT L/S BSO.  MAY CONVERT TO EX LAP BOWEL PREP GIVEN PT UNDRSTANDS THE RISK AE BLEEDING INFECTION DAMGE TO INTERNAL ORGANS SUCH AS BOWEL AND BLADDER.   Marlina Cataldi A Lawsyn Heiler 09/02/2022, 9:57 AM

## 2022-09-03 ENCOUNTER — Encounter (HOSPITAL_COMMUNITY): Payer: Self-pay | Admitting: Obstetrics and Gynecology

## 2022-09-10 LAB — SURGICAL PATHOLOGY

## 2022-09-18 ENCOUNTER — Encounter: Payer: Self-pay | Admitting: Gynecologic Oncology

## 2022-09-22 ENCOUNTER — Encounter: Payer: Self-pay | Admitting: Gynecologic Oncology

## 2022-09-22 ENCOUNTER — Inpatient Hospital Stay: Payer: 59 | Attending: Gynecologic Oncology | Admitting: Gynecologic Oncology

## 2022-09-22 VITALS — BP 114/67 | HR 68 | Temp 98.0°F | Resp 16 | Ht 66.14 in | Wt 165.2 lb

## 2022-09-22 DIAGNOSIS — Z90710 Acquired absence of both cervix and uterus: Secondary | ICD-10-CM | POA: Diagnosis not present

## 2022-09-22 DIAGNOSIS — K5909 Other constipation: Secondary | ICD-10-CM

## 2022-09-22 DIAGNOSIS — R1900 Intra-abdominal and pelvic swelling, mass and lump, unspecified site: Secondary | ICD-10-CM | POA: Insufficient documentation

## 2022-09-22 DIAGNOSIS — Z90721 Acquired absence of ovaries, unilateral: Secondary | ICD-10-CM | POA: Diagnosis not present

## 2022-09-22 DIAGNOSIS — F32A Depression, unspecified: Secondary | ICD-10-CM | POA: Insufficient documentation

## 2022-09-22 DIAGNOSIS — Z800 Family history of malignant neoplasm of digestive organs: Secondary | ICD-10-CM | POA: Insufficient documentation

## 2022-09-22 DIAGNOSIS — N951 Menopausal and female climacteric states: Secondary | ICD-10-CM | POA: Diagnosis not present

## 2022-09-22 DIAGNOSIS — K5900 Constipation, unspecified: Secondary | ICD-10-CM | POA: Diagnosis not present

## 2022-09-22 DIAGNOSIS — N8003 Adenomyosis of the uterus: Secondary | ICD-10-CM | POA: Insufficient documentation

## 2022-09-22 DIAGNOSIS — D251 Intramural leiomyoma of uterus: Secondary | ICD-10-CM | POA: Insufficient documentation

## 2022-09-22 DIAGNOSIS — R102 Pelvic and perineal pain: Secondary | ICD-10-CM

## 2022-09-22 DIAGNOSIS — Z9079 Acquired absence of other genital organ(s): Secondary | ICD-10-CM | POA: Diagnosis not present

## 2022-09-22 NOTE — Progress Notes (Signed)
GYNECOLOGIC ONCOLOGY NEW PATIENT CONSULTATION   Patient Name: Kim Gonzales  Patient Age: 51 y.o. Date of Service: 09/22/22 Referring Provider: Louis Meckel, MD  Primary Care Provider: Donald Prose, MD Consulting Provider: Jeral Pinch, MD   Assessment/Plan:  Postmenopausal patient with history of uterine fibroids and solid-appearing right pelvic mass abutting versus involving the bladder.  I discussed with the patient and her husband my review of her imaging as well as pathology reports and pictures from her most recent surgery.  At the time of her hysterectomy, significant adhesions were noted between the bladder and lower uterine segment/cervix and a solid mass was appreciated on the right.  It was left in situ at that time given concern for bladder injury with attempt at removal.  The patient's pathology confirmed uterine fibroids.  She had 3 C-sections prior to her hysterectomy and my suspicion is that this solid pelvic mass is likely a retroperitoneal fibroid.  It can be seen on imaging and 2017 and although there has been some slow growth, this appears to be an indolent process.  I think this points to this very likely being a benign process.  With regard to treatment options, I offered close surveillance with follow-up imaging versus surgical excision.  The patient has quite a long history of right-sided pelvic pain which may be related to this mass.  On exam, with palpation of the mass at the top of her vagina, I can elicit similar right-sided pelvic pain.  The patient is now in surgical menopause.  We discussed that if this is in fact a fibroid, there may be some shrinkage without estrogen stimulation.  The patient is symptomatic from menopausal symptoms and is in the process of pursuing the start of hormone replacement therapy.  I reviewed most recent MRI with one of our radiologist.  The right sided mass is in close proximity to the right ureter and at least abuts the bladder  posteriorly although it may invade the serosa and into the muscular layer.  Given proximity to the ureter, we discussed that an attempt at surgical excision would benefit from urology's involvement for ICG injection into the ureter and possible stent placement.  I also think there is high likelihood of bladder injury and given proximity to the UVJ, may be beneficial to have urology involved for any complex bladder repair necessary.  The patient would like some time to discuss options with her husband.  She voices understanding that surgery comes with some risks and that I cannot assure that that removing this mass will cause resolution of her pelvic pain.  We will tentatively schedule surgery for the end of the month.  The patient will call my office to let me know when she decides whether she would like to move forward with the surgery.  In the meantime, I will reach out to Dr. Louis Meckel about a joint surgery.  Given history of pancreatic cancer in her father, I placed a referral to genetics today.  A copy of this note was sent to the patient's referring provider.   70 minutes of total time was spent for this patient encounter, including preparation, face-to-face counseling with the patient and coordination of care, and documentation of the encounter.  Jeral Pinch, MD  Division of Gynecologic Oncology  Department of Obstetrics and Gynecology  [facility]  ___________________________________________  Chief Complaint: No chief complaint on file.   History of Present Illness:  Kim Gonzales is a 52 y.o. y.o. female who is seen  in consultation at the request of Dr. Louis Meckel for an evaluation of a pelvic mass.  Her history is as follows:  09/2019: Patient underwent diagnostic laparoscopy with conversion to open hysterectomy and left salpingectomy.  Findings were notable for an irregular and slightly enlarged uterus and dense adhesions between the bladder and lower  uterine segment/cervix.  It is described as "scar tissue seen on the right side and questionable fibroid."  Dr. Gloriann Loan with urology was consulted IntraOp and recommendation was made to leave the mass in situ given concern for possible bladder injury.  Fallopian tube adherent to the adnexa and was not able to be removed.  Pathology from the surgery revealed mild and acute chronic cervicitis.  Adenomyosis and leiomyoma were noted within the myometrium.  Patient then underwent colonoscopy in early May of this year.  She had right-sided shoulder pain as well as right-sided abdominal pain with radiation down her right leg.  Presentation to the emergency department was recommended.  CT scan on 5/5 showed a 4.1 cm oval enhancing lesion in the right pelvis posterior to the bladder as well as a 4.3 cystic structure within the left anterior pelvis, both thought to be adnexal lesions.  05/18/2022: Follow-up pelvic ultrasound shows a 3.6 x 2.3 x 3.6 cm solid vascular right adnexal mass, separate from the bladder.  Previously noted left adnexal cyst not visualized.  05/20/2022: Pelvic MRI shows contrast enhancing solid mass in the vicinity of the right adnexa measuring 3.9 x 3.3 cm, retrospective review shows mass present although slightly smaller, 2.8 x 1.9 cm, in 02/2016.  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  Previously seen cystic lesion of the left ovary is markedly diminished in size, now measuring no greater than 1.2 cm.  Given pelvic pain that developed over the last year, worse this past summer, patient was taken for BSO in November.  On 09/02/2022, patient underwent laparoscopic bilateral salpingo-oophorectomy.  Both adnexa were normal in appearance.  A hard, cystic mass, involved in the bladder was described.  Pathology revealed bilateral ovaries with benign luteal cyst, no malignancy.  The patient reports a very long history of right  lower quadrant pain, that she remembers having as far back as her childhood.  Pain slowly worsened with time.  She thought that it was related to ovulation.  Since surgery, she has had maybe some worsening of this "pulling" sensation in her right lower quadrant.  She also has some more recent back and right hip pain that developed after surgery, although thinks this may be related to overdoing it during her recovery.  Patient notes some incisional pain.  She has constipation at baseline intermittently, worse since surgery.  Is currently using laxatives.  She denies any bladder symptoms.  She endorses small hot flashes that started within the last 2 years.  This January, she had blood work done and was told that she was not menopausal yet.  Since surgery, she has become significantly more symptomatic.  Family history is notable for pancreatic cancer in her father.  She thinks that both of her brothers got genetic testing but is unsure regarding these results.  Prior to her hysterectomy, she had 3 C-sections, 1 in [place] and the other 2 in [place].  Her last C-section was in 2005 at [facility] in [place].  PAST MEDICAL HISTORY:  Past Medical History:  Diagnosis Date   Anemia    Depression    Dyspareunia in female  03/26/2016   ESBL E. coli carrier 05/20/2016   Headache    Migraines   History of postpartum depression    Lightheadedness 03/26/2016   Myalgia 03/26/2016   Pneumonia    Vaginal pruritus 03/26/2016     PAST SURGICAL HISTORY:  Past Surgical History:  Procedure Laterality Date   ABDOMINAL HYSTERECTOMY     CESAREAN SECTION     x3   compound fracture Right    Compound Fracture Right arm   CYSTOSCOPY N/A 09/27/2019   Procedure: CYSTOSCOPY;  Surgeon: Crawford Givens, MD;  Location: [place];  Service: Gynecology;  Laterality: N/A;   DILATION AND CURETTAGE OF UTERUS     DILITATION & CURRETTAGE/HYSTROSCOPY WITH HYDROTHERMAL ABLATION N/A 10/10/2015    Procedure: DILATATION & CURETTAGE/HYSTEROSCOPY WITH HYDROTHERMAL ABLATION;  Surgeon: Crawford Givens, MD;  Location: [facility];  Service: Gynecology;  Laterality: N/A;   KNEE ARTHROSCOPY     LAPAROSCOPIC SALPINGO OOPHERECTOMY N/A 09/02/2022   Procedure: LAPAROSCOPIC SALPINGO OOPHORECTOMY;  Surgeon: Crawford Givens, MD;  Location: [place];  Service: Gynecology;  Laterality: N/A;   LAPAROSCOPIC VAGINAL HYSTERECTOMY WITH SALPINGECTOMY Bilateral 09/27/2019   Procedure: LAPAROSCOPIC CONVERTED TO OPEN ABDOMINAL HYSTERECTOMY WITH  LEFT SALPINGECTOMY AND EXTENSIVE LYSIS OF ADHESIONS;  Surgeon: Crawford Givens, MD;  Location: [place];  Service: Gynecology;  Laterality: Bilateral;   LAPAROTOMY N/A 09/02/2022   Procedure: POSSIBLE EXPLORATORY LAPAROTOMY;  Surgeon: Crawford Givens, MD;  Location: [place];  Service: Gynecology;  Laterality: N/A;   SHOULDER ARTHROSCOPY     TUBAL LIGATION     pptl   WISDOM TOOTH EXTRACTION      OB/GYN HISTORY:  OB History  Gravida Para Term Preterm AB Living  3 3          SAB IAB Ectopic Multiple Live Births               # Outcome Date GA Lbr Len/2nd Weight Sex Delivery Anes PTL Lv  3 Para           2 Para           1 Para             Patient's last menstrual period was 02/09/2016.  Age at menarche: 7 Age at menopause: 28 Hx of HRT: denies Hx of STDs: Denies  Last pap: 2014 - N[facility], HR HPV negative History of abnormal pap smears: denies  SCREENING STUDIES:  Last mammogram: 2023  Last colonoscopy: 2023  MEDICATIONS: Outpatient Encounter Medications as of 09/22/2022  Medication Sig   Cholecalciferol (VITAMIN D3) 25 MCG (1000 UT) CHEW Chew 2,000 Units by mouth 2 (two) times daily.   cyanocobalamin (VITAMIN B12) 1000 MCG tablet Take 1,000 mcg by mouth daily.   HYDROcodone-acetaminophen (NORCO/VICODIN) 5-325 MG tablet Take 1 tablet by mouth every 6 (six) hours as needed for severe pain.   naproxen sodium (ALEVE) 220 MG tablet Take 880 mg by mouth 2  (two) times daily as needed (migraines/headaches).   No facility-administered encounter medications on file as of 09/22/2022.    ALLERGIES:  Allergies  Allergen Reactions   Bactrim [Sulfamethoxazole-Trimethoprim] Other (See Comments)    ? Of bactrim induced neutropenia. Not clear this was the cause though.   Ciprofloxacin Other (See Comments)    Facial loss of freckles.     FAMILY HISTORY:  Family History  Problem Relation Age of Onset   Alzheimer's disease Mother    Cancer Mother        "some type of  cancer but unsure what kind"   Pancreatic cancer Father    Alzheimer's disease Maternal Grandfather    Kidney nephrosis Neg Hx    Colon cancer Neg Hx    Breast cancer Neg Hx    Ovarian cancer Neg Hx    Endometrial cancer Neg Hx      SOCIAL HISTORY:  Social Connections: Not on file    REVIEW OF SYSTEMS:  + chills, fatigue, constipation, hot flashes, back pain, joint pain, dizziness, itching, gait problem, migraine, easy bruising/bleeding, depression, decreased concentration. Denies appetite changes, fevers, unexplained weight changes. Denies hearing loss, neck lumps or masses, mouth sores, ringing in ears or voice changes. Denies cough or wheezing.  Denies shortness of breath. Denies chest pain or palpitations. Denies leg swelling. Denies abdominal distention, pain, blood in stools, constipation, diarrhea, nausea, vomiting, or early satiety. Denies pain with intercourse, dysuria, frequency, hematuria or incontinence. Denies vaginal bleeding or vaginal discharge.  Denies rash, or wounds. Denies headaches, numbness or seizures. Denies swollen lymph nodes or glands. Denies anxiety, confusion.  Physical Exam:  Vital Signs for this encounter:  Blood pressure 114/67, pulse 68, temperature 98 F (36.7 C), temperature source Oral, resp. rate 16, height 5' 6.14" (1.68 m), weight 165 lb 3.2 oz (74.9 kg), last menstrual period 02/09/2016, SpO2 100 %. Body mass index is 26.55  kg/m. General: Alert, oriented, no acute distress.  HEENT: Normocephalic, atraumatic. Sclera anicteric.  Chest: Clear to auscultation bilaterally. No wheezes, rhonchi, or rales. Cardiovascular: Regular rate and rhythm, no murmurs, rubs, or gallops.  Abdomen: Normoactive bowel sounds. Soft, nondistended, nontender to palpation except with deep palpation in the right lower quadrant. No masses or hepatosplenomegaly appreciated. No palpable fluid wave.  Well-healed laparoscopic incisions.  Pfannenstiel incision. Extremities: Grossly normal range of motion. Warm, well perfused. No edema bilaterally.  Skin: No rashes or lesions.  Lymphatics: No cervical, supraclavicular, or inguinal adenopathy.  GU:  Normal external female genitalia. No lesions. No discharge or bleeding.             Bladder/urethra:  No lesions or masses, well supported bladder             Vagina: Well-rugated, no lesions or masses noted.             Cervix/uterus: Surgically absent.             Adnexa: On palpation of the cuff, there is a distinct firm mass tethered to the right aspect of the cuff, palpable both with the vaginal hand as well as the abdominal hand.  Palpation along this area causes pain.  Rectal: Rectum does not feel tethered, unable to appreciate the mass via rectal exam.  LABORATORY AND RADIOLOGIC DATA:  Outside medical records were reviewed to synthesize the above history, along with the history and physical obtained during the visit.   Lab Results  Component Value Date   WBC 4.9 08/25/2022   HGB 13.6 08/25/2022   HCT 41.0 08/25/2022   PLT 257 08/25/2022   GLUCOSE 97 08/25/2022   ALT 17 08/25/2022   AST 24 08/25/2022   NA 138 08/25/2022   K 5.0 08/25/2022   CL 106 08/25/2022   CREATININE 1.02 (H) 08/25/2022   BUN 9 08/25/2022   CO2 26 08/25/2022   INR 1.35 03/04/2016

## 2022-09-22 NOTE — Patient Instructions (Addendum)
It was very nice to meet you today.  We discussed that this mass along the right side of your pelvis and at the top of the vagina is likely benign based on its existence since at least 2017 and very slow growth over the last 6 years.  Based on its appearance, I suspect that it may be a fibroid.  We discussed options of close imaging follow-up versus attempt at surgical removal.  Please let our office know when you have made a decision about surgery at [phone].  We will tentatively plan for surgery at [facility] with Dr. Jeral Pinch on October 22, 2022. We will see you back in the office closer to the date for a preop appointment with Joylene John NP to discuss the instrcutions for before and after surgery.  You may also receive a phone call from the hospital to arrange for a pre-op appointment there as well. Usually both appointments can be combined on the same day.   If you decide to proceed with surgery, we will bring you back to discuss the below instructions in detail:  Preparing for your Surgery  Plan for surgery on October 22, 2022 with Dr. Jeral Pinch at [facility].   Pre-operative [place] will receive a phone call from presurgical testing at [facility] to arrange for a pre-operative appointment and lab work.  -Bring your insurance card, copy of an advanced directive if applicable, medication list  -At that visit, you will be asked to sign a consent for a possible blood transfusion in case a transfusion becomes necessary during surgery.  The need for a blood transfusion is rare but having consent is a necessary part of your care.     -You should not be taking blood thinners or aspirin at least ten days prior to surgery unless instructed by your surgeon.  -Do not take supplements such as fish oil (omega 3), red yeast rice, turmeric before your surgery. You want to avoid medications with aspirin in them including headache powders such  as BC or Goody's), Excedrin migraine.  Day Before Surgery at [place] will be asked to take in a light diet the day before surgery. You will be advised you can have clear liquids up until 3 hours before your surgery.    Eat a light diet the day before surgery.  Examples including soups, broths, toast, yogurt, mashed potatoes.  AVOID GAS PRODUCING FOODS. Things to avoid include carbonated beverages (fizzy beverages, sodas), raw fruits and raw vegetables (uncooked), or beans.   If your bowels are filled with gas, your surgeon will have difficulty visualizing your pelvic organs which increases your surgical risks.  Your role in recovery Your role is to become active as soon as directed by your doctor, while still giving yourself time to heal.  Rest when you feel tired. You will be asked to do the following in order to speed your recovery:  - Cough and breathe deeply. This helps to clear and expand your lungs and can prevent pneumonia after surgery.  - [place]. Do mild physical activity. Walking or moving your legs help your circulation and body functions return to normal. Do not try to get up or walk alone the first time after surgery.   -If you develop swelling on one leg or the other, pain in the back of your leg, redness/warmth in one of your legs, please call the office or go to the Emergency Room to have  a doppler to rule out a blood clot. For shortness of breath, chest pain-seek care in the Emergency Room as soon as possible. - Actively manage your pain. Managing your pain lets you move in comfort. We will ask you to rate your pain on a scale of zero to 10. It is your responsibility to tell your doctor or nurse where and how much you hurt so your pain can be treated.  Special Considerations -If you are diabetic, you may be placed on insulin after surgery to have closer control over your blood sugars to promote healing and recovery.  This does not mean that you will be  discharged on insulin.  If applicable, your oral antidiabetics will be resumed when you are tolerating a solid diet.  -Your final pathology results from surgery should be available around one week after surgery and the results will be relayed to you when available.  -FMLA forms can be faxed to [phone] and please allow 5-7 business days for completion.  Pain Management After Surgery -You will be prescribed your pain medication and bowel regimen medications before surgery so that you can have these available when you are discharged from the hospital. The pain medication is for use ONLY AFTER surgery and a new prescription will not be given.   -Make sure that you have Tylenol and Ibuprofen IF YOU ARE ABLE TO TAKE THESE MEDICATIONS at home to use on a regular basis after surgery for pain control. We recommend alternating the medications every hour to six hours since they work differently and are processed in the body differently for pain relief.  -Review the attached handout on narcotic use and their risks and side effects.   Bowel Regimen -You will be prescribed Sennakot-S to take nightly to prevent constipation especially if you are taking the narcotic pain medication intermittently.  It is important to prevent constipation and drink adequate amounts of liquids. You can stop taking this medication when you are not taking pain medication and you are back on your normal bowel routine.  Risks of Surgery Risks of surgery are low but include bleeding, infection, damage to surrounding structures, re-operation, blood clots, and very rarely death.   Blood Transfusion Information (For the consent to be signed before surgery)  We will be checking your blood type before surgery so in case of emergencies, we will know what type of blood you would need.                                            WHAT IS A BLOOD TRANSFUSION?  A transfusion is the replacement of blood or some of its parts. Blood is  made up of multiple cells which provide different functions. Red blood cells carry oxygen and are used for blood loss replacement. White blood cells fight against infection. Platelets control bleeding. Plasma helps clot blood. Other blood products are available for specialized needs, such as hemophilia or other clotting disorders. BEFORE THE TRANSFUSION  Who gives blood for transfusions?  You may be able to donate blood to be used at a later date on yourself (autologous donation). Relatives can be asked to donate blood. This is generally not any safer than if you have received blood from a stranger. The same precautions are taken to ensure safety when a relative's blood is donated. Healthy volunteers who are fully evaluated to make sure their blood is  safe. This is blood bank blood. Transfusion therapy is the safest it has ever been in the practice of medicine. Before blood is taken from a donor, a complete history is taken to make sure that person has no history of diseases nor engages in risky social behavior (examples are intravenous drug use or sexual activity with multiple partners). The donor's travel history is screened to minimize risk of transmitting infections, such as malaria. The donated blood is tested for signs of infectious diseases, such as HIV and hepatitis. The blood is then tested to be sure it is compatible with you in order to minimize the chance of a transfusion reaction. If you or a relative donates blood, this is often done in anticipation of surgery and is not appropriate for emergency situations. It takes many days to process the donated blood. RISKS AND COMPLICATIONS Although transfusion therapy is very safe and saves many lives, the main dangers of transfusion include:  Getting an infectious disease. Developing a transfusion reaction. This is an allergic reaction to something in the blood you were given. Every precaution is taken to prevent this. The decision to have a  blood transfusion has been considered carefully by your caregiver before blood is given. Blood is not given unless the benefits outweigh the risks.  AFTER SURGERY INSTRUCTIONS  Return to work: 4-6 weeks if applicable  Activity: 1. Be up and out of the bed during the day.  Take a nap if needed.  You may walk up steps but be careful and use the hand rail.  Stair climbing will tire you more than you think, you may need to stop part way and rest.   2. No lifting or straining for 6 weeks over 10 pounds. No pushing, pulling, straining for 6 weeks.  3. No driving for 2-45 days.  Do not drive if you are taking narcotic pain medicine and make sure that your reaction time has returned.   4. You can shower as soon as the next day after surgery. Shower daily.  Use your regular soap and water (not directly on the incision) and pat your incision(s) dry afterwards; don't rub.  No tub baths or submerging your body in water until cleared by your surgeon. If you have the soap that was given to you by pre-surgical testing that was used before surgery, you do not need to use it afterwards because this can irritate your incisions.   5. No sexual activity and nothing in the vagina for 4 weeks.  6. You may experience a small amount of clear drainage from your incisions, which is normal.  If the drainage persists, increases, or changes color please call the office.  7. Do not use creams, lotions, or ointments such as neosporin on your incisions after surgery until advised by your surgeon because they can cause removal of the dermabond glue on your incisions.    8. Take Tylenol or ibuprofen first for pain if you are able to take these medications and only use narcotic pain medication for severe pain not relieved by the Tylenol or Ibuprofen.  Monitor your Tylenol intake to a max of 4,000 mg in a 24 hour period. You can alternate these medications after surgery.  Diet: 1. Low sodium Heart Healthy Diet is recommended  but you are cleared to resume your normal (before surgery) diet after your procedure.  2. It is safe to use a laxative, such as Miralax or Colace, if you have difficulty moving your bowels. You have been prescribed  Sennakot-S to take at bedtime every evening after surgery to keep bowel movements regular and to prevent constipation.    Wound Care: 1. Keep clean and dry.  Shower daily.  Reasons to call the Doctor: Fever - Oral temperature greater than 100.4 degrees Fahrenheit Foul-smelling vaginal discharge Difficulty urinating Nausea and vomiting Increased pain at the site of the incision that is unrelieved with pain medicine. Difficulty breathing with or without chest pain New calf pain especially if only on one side Sudden, continuing increased vaginal bleeding with or without clots.   Contacts: For questions or concerns you should contact:  Dr. Jeral Pinch at [phone]  Joylene John, NP at [phone]  After Hours: call [phone] and have the GYN Oncologist paged/contacted (after 5 pm or on the weekends).  Messages sent via mychart are for non-urgent matters and are not responded to after hours so for urgent needs, please call the after hours number.

## 2022-09-23 ENCOUNTER — Other Ambulatory Visit: Payer: Self-pay | Admitting: Gynecologic Oncology

## 2022-09-23 DIAGNOSIS — R1900 Intra-abdominal and pelvic swelling, mass and lump, unspecified site: Secondary | ICD-10-CM

## 2022-09-24 ENCOUNTER — Telehealth: Payer: Self-pay | Admitting: *Deleted

## 2022-09-24 NOTE — Telephone Encounter (Signed)
Patient called and stated "I saw Dr Berline Lopes earlier this week and she scheduled me for surgery on 12/28. I saw Dr Louis Meckel today and after a discussion with him I want to postpone the surgery. I want to go with her other recommendation for imaging surveillance. Dr Louis Meckel and I will leave the timing of scans to Dr Charisse March discretion. I also wanted to let her know how wonderful and amazing she and her staff was."

## 2022-10-01 ENCOUNTER — Other Ambulatory Visit: Payer: Self-pay | Admitting: Gynecologic Oncology

## 2022-10-01 ENCOUNTER — Encounter: Payer: Self-pay | Admitting: Gynecologic Oncology

## 2022-10-01 DIAGNOSIS — R1900 Intra-abdominal and pelvic swelling, mass and lump, unspecified site: Secondary | ICD-10-CM

## 2022-10-06 ENCOUNTER — Telehealth: Payer: Self-pay | Admitting: *Deleted

## 2022-10-06 NOTE — Telephone Encounter (Signed)
Per Dr Berline Lopes, [facility] for the patient to call the office back. Patient to be scheduled for a MRI and follow up appt with Dr Berline Lopes in May

## 2022-10-07 NOTE — Telephone Encounter (Signed)
Per Dr Berline Lopes, [facility] for the patient to call the office back. Patient to be scheduled for a MRI and follow up appt with Dr Berline Lopes in May

## 2022-10-08 NOTE — Telephone Encounter (Signed)
Spoke with the patient and scheduled for an MRI on 5/6 at 7 am. Patient scheduled for a follow up with Dr Berline Lopes on 5/9 at 3:45 pm. Patient also changed her genetics appt from 3/5 to 3/25

## 2022-10-12 ENCOUNTER — Encounter: Payer: 59 | Admitting: Gynecologic Oncology

## 2022-10-13 ENCOUNTER — Inpatient Hospital Stay: Payer: 59 | Admitting: Gynecologic Oncology

## 2022-10-22 ENCOUNTER — Ambulatory Visit: Admit: 2022-10-22 | Payer: 59 | Admitting: Gynecologic Oncology

## 2022-10-22 DIAGNOSIS — R1900 Intra-abdominal and pelvic swelling, mass and lump, unspecified site: Secondary | ICD-10-CM

## 2022-10-22 SURGERY — LYSIS, ADHESIONS, ROBOT-ASSISTED, LAPAROSCOPIC
Anesthesia: General

## 2022-12-29 ENCOUNTER — Other Ambulatory Visit: Payer: 59

## 2022-12-29 ENCOUNTER — Encounter: Payer: 59 | Admitting: Genetic Counselor

## 2023-01-06 IMAGING — DX DG CHEST 1V PORT
1 series · 1 of 1 positions shown · non-contrast
Comparison: Chest x-ray dated March 04, 2016

CLINICAL DATA: Shoulder pain

EXAM:
PORTABLE CHEST 1 VIEW

[chest ap]
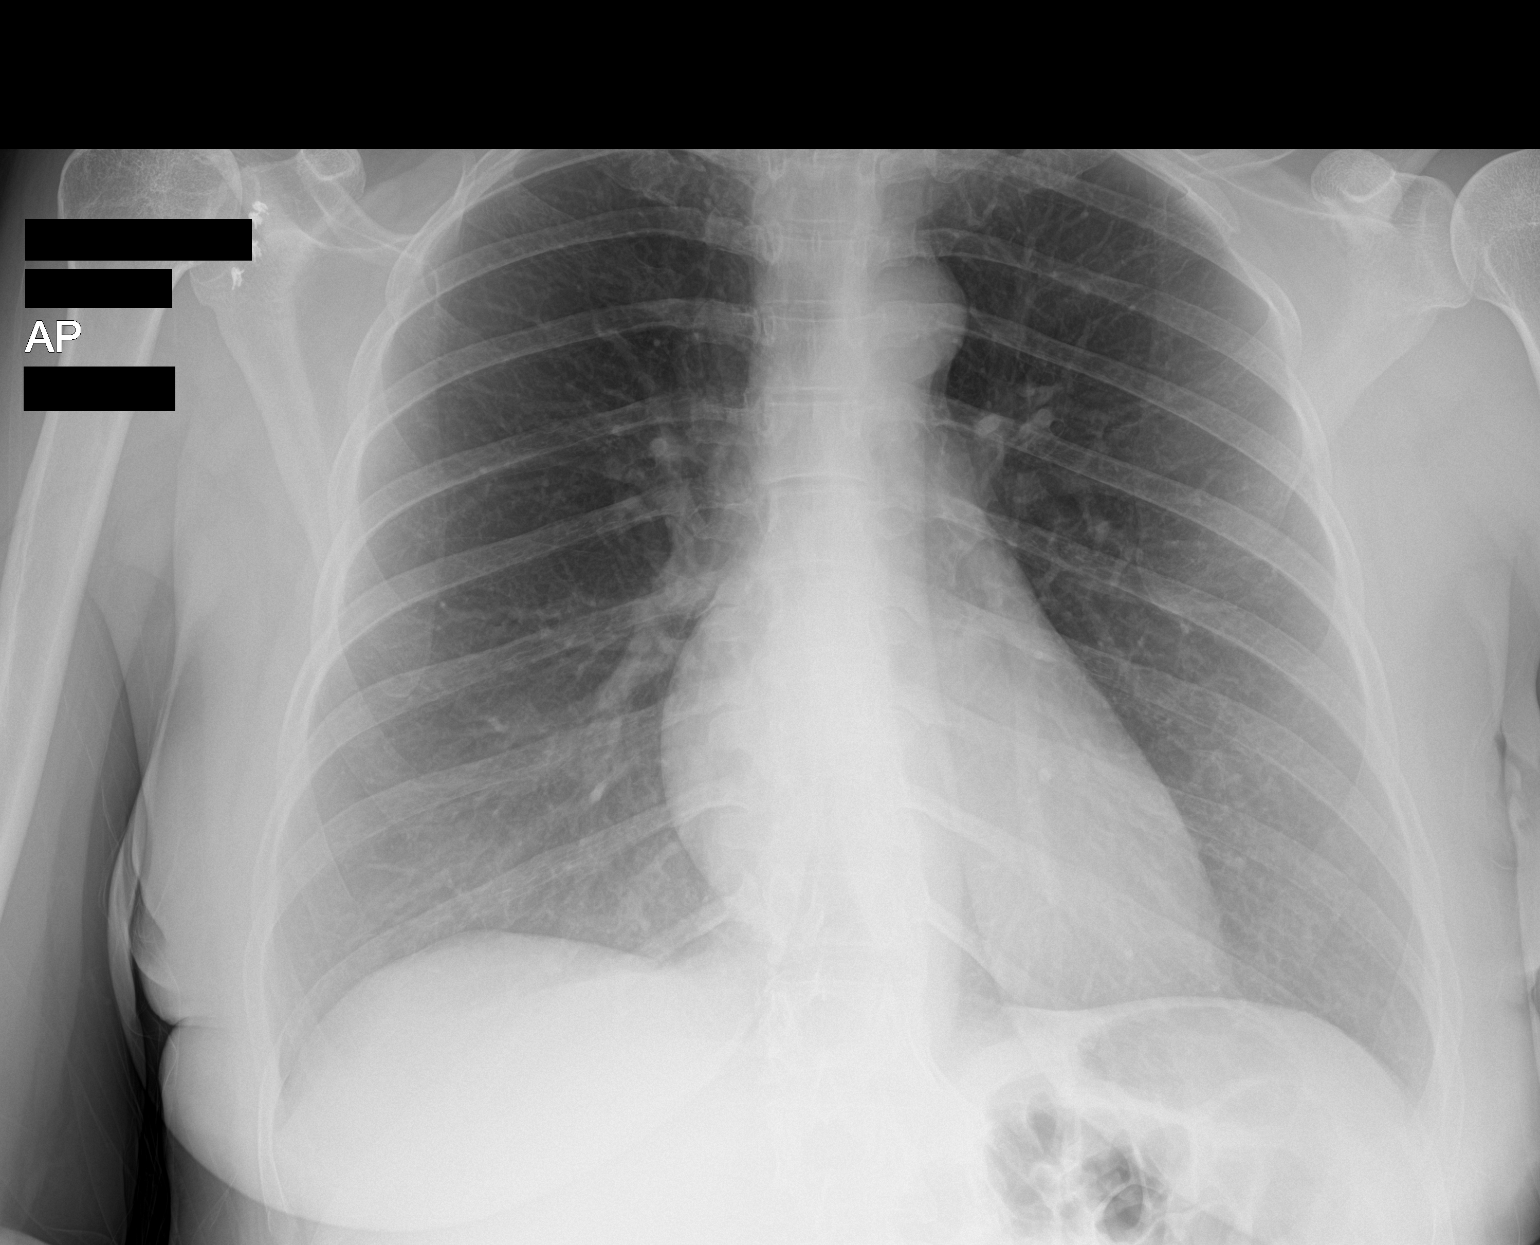

[1 of 1 positions shown; findings below may reference images not displayed]

FINDINGS: The heart size and mediastinal contours are within normal limits.
Both lungs are clear. The visualized skeletal structures are
unremarkable.
IMPRESSION: No active disease.

## 2023-01-18 ENCOUNTER — Other Ambulatory Visit: Payer: Self-pay

## 2023-01-18 ENCOUNTER — Inpatient Hospital Stay: Payer: 59 | Attending: Genetic Counselor | Admitting: Licensed Clinical Social Worker

## 2023-01-18 ENCOUNTER — Encounter: Payer: Self-pay | Admitting: Licensed Clinical Social Worker

## 2023-01-18 ENCOUNTER — Inpatient Hospital Stay: Payer: 59

## 2023-01-18 DIAGNOSIS — Z800 Family history of malignant neoplasm of digestive organs: Secondary | ICD-10-CM | POA: Diagnosis not present

## 2023-01-18 NOTE — Progress Notes (Signed)
REFERRING PROVIDER: Lafonda Mosses, MD [place],  [place] [id number]  PRIMARY PROVIDER:  Donald Prose, MD  PRIMARY REASON FOR VISIT:  1. Family history of pancreatic cancer     HISTORY OF PRESENT ILLNESS:   Kim Gonzales, a 52 y.o. female, was seen for a [place] cancer genetics consultation at the request of Dr. Berline Lopes due to a family history of pancreatic cancer.  Ms. Divens presents to clinic today to discuss the possibility of a hereditary predisposition to cancer, genetic testing, and to further clarify her future cancer risks, as well as potential cancer risks for family members.   CANCER HISTORY:  Kim Gonzales is a 52 y.o. female with no personal history of cancer.    RISK FACTORS:  Menarche was at age 77.  First live birth at age 3.  Ovaries intact: no.  Hysterectomy: yes.  Menopausal status: postmenopausal.  Colonoscopy: yes; normal. Mammogram within the last year: yes. Number of breast biopsies: 0. Up to date with pelvic exams: yes.  Past Medical History:  Diagnosis Date   Anemia    Depression    Dyspareunia in female 03/26/2016   ESBL E. coli carrier 05/20/2016   Headache    Migraines   History of postpartum depression    Lightheadedness 03/26/2016   Myalgia 03/26/2016   Pneumonia    Vaginal pruritus 03/26/2016    Past Surgical History:  Procedure Laterality Date   ABDOMINAL HYSTERECTOMY     CESAREAN SECTION     x3   compound fracture Right    Compound Fracture Right arm   CYSTOSCOPY N/A 09/27/2019   Procedure: CYSTOSCOPY;  Surgeon: Crawford Givens, MD;  Location: [facility];  Service: Gynecology;  Laterality: N/A;   DILATION AND CURETTAGE OF UTERUS     DILITATION & CURRETTAGE/HYSTROSCOPY WITH HYDROTHERMAL ABLATION N/A 10/10/2015   Procedure: DILATATION & CURETTAGE/HYSTEROSCOPY WITH HYDROTHERMAL ABLATION;  Surgeon: Crawford Givens, MD;  Location: [facility];  Service: Gynecology;  Laterality: N/A;   KNEE ARTHROSCOPY      LAPAROSCOPIC SALPINGO OOPHERECTOMY N/A 09/02/2022   Procedure: LAPAROSCOPIC SALPINGO OOPHORECTOMY;  Surgeon: Crawford Givens, MD;  Location: [facility];  Service: Gynecology;  Laterality: N/A;   LAPAROSCOPIC VAGINAL HYSTERECTOMY WITH SALPINGECTOMY Bilateral 09/27/2019   Procedure: LAPAROSCOPIC CONVERTED TO OPEN ABDOMINAL HYSTERECTOMY WITH  LEFT SALPINGECTOMY AND EXTENSIVE LYSIS OF ADHESIONS;  Surgeon: Crawford Givens, MD;  Location: [facility];  Service: Gynecology;  Laterality: Bilateral;   LAPAROTOMY N/A 09/02/2022   Procedure: POSSIBLE EXPLORATORY LAPAROTOMY;  Surgeon: Crawford Givens, MD;  Location: [facility];  Service: Gynecology;  Laterality: N/A;   SHOULDER ARTHROSCOPY     TUBAL LIGATION     pptl   WISDOM TOOTH EXTRACTION      FAMILY HISTORY:  We obtained a detailed, 4-generation family history.  Significant diagnoses are listed below: Family History  Problem Relation Age of Onset   Alzheimer's disease Mother    Cancer Mother        unk type   Pancreatic cancer Father 53   Alzheimer's disease Maternal Grandfather    Cancer Other        pat great aunts x2 - unk type   Kidney nephrosis Neg Hx    Colon cancer Neg Hx    Breast cancer Neg Hx    Ovarian cancer Neg Hx    Endometrial cancer Neg Hx    Kim Gonzales has 2 sons and 1 daughter. She has 3 brothers, none have had cancer. Two  brothers reportedly have had genetic testing, unknown results.  Kim Gonzales mother had cancer, unknown type, and is now 15 with Alzheimer's. No other known cancers on this side of the family.  Kim Gonzales father died of pancreatic cancer at 18. Two paternal great aunts and a first cousin once removed all died of cancer, unknown types.  Kim Gonzales is aware of previous family history of genetic testing for hereditary cancer risks. There is no reported Ashkenazi Jewish ancestry. There is no known consanguinity.    GENETIC COUNSELING ASSESSMENT: Kim Gonzales is a 52 y.o. female with a family history of  pancreatic cancer which is somewhat suggestive of a hereditary cancer syndrome and predisposition to cancer. We, therefore, discussed and recommended the following at today's visit.   DISCUSSION: We discussed that approximately 10% of pancreatic cancer is hereditary. Most cases of hereditary pancreatic cancer are associated with BRCA1/BRCA2 genes, although there are other genes associated with hereditary cancer as well. Cancers and risks are gene specific. We discussed that testing is beneficial for several reasons including knowing about cancer risks, identifying potential screening and risk-reduction options that may be appropriate, and to understand if other family members could be at risk for cancer and allow them to undergo genetic testing.   We reviewed the characteristics, features and inheritance patterns of hereditary cancer syndromes. We also discussed genetic testing, including the appropriate family members to test, the process of testing, insurance coverage and turn-around-time for results. We discussed the implications of a negative, positive and/or variant of uncertain significant result. We recommended Kim Gonzales pursue genetic testing for the [facility] Multi-Cancer+RNA gene panel.   Based on Kim Gonzales's family history of cancer, she meets medical criteria for genetic testing. Despite that she meets criteria, she may still have an out of pocket cost. We discussed that if her out of pocket cost for testing is over $100, the laboratory will call and confirm whether she wants to proceed with testing.  If the out of pocket cost of testing is less than $100 she will be billed by the genetic testing laboratory.   PLAN: After considering the risks, benefits, and limitations, Kim Gonzales did not wish to pursue genetic testing at today's visit.  She would like to wait until October for testing. She is scheduled for a blood draw on October 7. We, therefore, recommend Kim Gonzales continue to follow the cancer  screening guidelines given by her primary healthcare provider.  Kim Gonzales questions were answered to her satisfaction today. Our contact information was provided should additional questions or concerns arise. Thank you for the referral and allowing [place] to share in the care of your patient.   Faith Rogue, MS, [facility] Genetic Counselor [email or web address] Phone: [phone]  The patient was seen for a total of 35 minutes in face-to-face genetic counseling.  Dr. Grayland Ormond was available for discussion regarding this case.   _______________________________________________________________________ For Office Staff:  Number of people involved in session: 1 Was an Intern/ student involved with case: no

## 2023-03-01 ENCOUNTER — Ambulatory Visit (HOSPITAL_COMMUNITY): Admission: RE | Admit: 2023-03-01 | Payer: 59 | Source: Ambulatory Visit

## 2023-03-04 ENCOUNTER — Inpatient Hospital Stay: Payer: 59 | Admitting: Gynecologic Oncology

## 2023-03-04 ENCOUNTER — Telehealth: Payer: Self-pay

## 2023-03-04 DIAGNOSIS — R1900 Intra-abdominal and pelvic swelling, mass and lump, unspecified site: Secondary | ICD-10-CM

## 2023-03-04 NOTE — Telephone Encounter (Signed)
Per Dr.Tucker I called Ms.Rohlfs to reschedule her appointment for today and also her MRI that she missed on 5/6.   She states she has to wait until January of next year d/t the scan being around $800. She apologizes for having missed the scan and for not being able to come today.  Dr. Pricilla Holm aware and states that is fine but pt to call with any concerns.   Pt states for a few months she has had lower right side pelvic pain, more nagging than pain. 3/10 on pain scale. It has gotten more noticeable recently. She states it feels like when she used to ovulate. It is constant and nothing makes it better/worse. No fever/chills. Normal BM's and no UTI S&S. She has increased her water intake thinking she was getting dehydrated, hasn't noticed any change in symptoms.   I asked pt if she wanted to keep appointment today and she states she has a meeting at 3:30 so she will not be able to come. Dr. Pricilla Holm notified.

## 2023-03-05 NOTE — Telephone Encounter (Signed)
Pt is scheduled for a phone visit, (per her request) On Thursday 5/16 @ 4:20.

## 2023-03-05 NOTE — Telephone Encounter (Signed)
I'm happy to see her or have phone follow-up with her. Typically, in the setting of new or worsening symptoms, I would recommend imaging to make sure there has not been significant change in size of her known mass, or that it looks more concerning, or that it is causing compression on the tube that brings urine from her kidney to the bladder on the right.

## 2023-03-11 ENCOUNTER — Inpatient Hospital Stay: Payer: 59 | Attending: Genetic Counselor | Admitting: Gynecologic Oncology

## 2023-03-11 ENCOUNTER — Encounter: Payer: Self-pay | Admitting: Gynecologic Oncology

## 2023-03-11 DIAGNOSIS — R1900 Intra-abdominal and pelvic swelling, mass and lump, unspecified site: Secondary | ICD-10-CM | POA: Diagnosis not present

## 2023-03-11 DIAGNOSIS — R102 Pelvic and perineal pain: Secondary | ICD-10-CM | POA: Diagnosis not present

## 2023-03-11 NOTE — Progress Notes (Signed)
Gynecologic Oncology Telehealth Note: Gyn-Onc  I connected with Kim Gonzales on 03/11/23 at  4:20 PM EDT by telephone and verified that I am speaking with the correct person using two identifiers.  I discussed the limitations, risks, security and privacy concerns of performing an evaluation and management service by telemedicine and the availability of in-person appointments. I also discussed with the patient that there may be a patient responsible charge related to this service. The patient expressed understanding and agreed to proceed.  Other persons participating in the visit and their role in the encounter: none.  Patient's location: home Provider's location: [facility]  Reason for Visit: follow-up  Treatment History: 09/2019: Patient underwent diagnostic laparoscopy with conversion to open hysterectomy and left salpingectomy.  Findings were notable for an irregular and slightly enlarged uterus and dense adhesions between the bladder and lower uterine segment/cervix.  It is described as "scar tissue seen on the right side and questionable fibroid."  Dr. Alvester Morin with urology was consulted IntraOp and recommendation was made to leave the mass in situ given concern for possible bladder injury.  Fallopian tube adherent to the adnexa and was not able to be removed.  Pathology from the surgery revealed mild and acute chronic cervicitis.  Adenomyosis and leiomyoma were noted within the myometrium.   Patient then underwent colonoscopy in early May of this year.  She had right-sided shoulder pain as well as right-sided abdominal pain with radiation down her right leg.  Presentation to the emergency department was recommended.  CT scan on 5/5 showed a 4.1 cm oval enhancing lesion in the right pelvis posterior to the bladder as well as a 4.3 cystic structure within the left anterior pelvis, both thought to be adnexal lesions.   05/18/2022: Follow-up pelvic ultrasound shows a 3.6 x 2.3 x 3.6 cm solid vascular right  adnexal mass, separate from the bladder.  Previously noted left adnexal cyst not visualized.   05/20/2022: Pelvic MRI shows contrast enhancing solid mass in the vicinity of the right adnexa measuring 3.9 x 3.3 cm, retrospective review shows mass present although slightly smaller, 2.8 x 1.9 cm, in 02/2016.  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  Previously seen cystic lesion of the left ovary is markedly diminished in size, now measuring no greater than 1.2 cm.   Given pelvic pain that developed over the last year, worse this past summer, patient was taken for BSO in November.  On 09/02/2022, patient underwent laparoscopic bilateral salpingo-oophorectomy.  Both adnexa were normal in appearance.  A hard, cystic mass, involved in the bladder was described.  Pathology revealed bilateral ovaries with benign luteal cyst, no malignancy.   The patient reports a very long history of right lower quadrant pain, that she remembers having as far back as her childhood.  Pain slowly worsened with time.  She thought that it was related to ovulation.  Since surgery, she has had maybe some worsening of this "pulling" sensation in her right lower quadrant.  She also has some more recent back and right hip pain that developed after surgery, although thinks this may be related to overdoing it during her recovery.  MRI pelvis 04/2023: 1. Contrast enhancing solid mass in the vicinity of the right adnexa measuring 3.9 x 3.3 cm, in keeping with findings of recent prior CT and ultrasound, and in retrospective review of examination dated 03/05/2016, this mass was present although slightly smaller, measuring 2.8 x 1.9 cm at that  time. 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 2. Previously seen cystic lesion of the left ovary  is markedly diminished in size, now measuring no greater than 1.2 cm, consistent with a since involuted hemorrhagic cyst or follicle. No further follow-up or characterization is specifically required for this lesion. 3. Status post hysterectomy.  Interval History: Doing well. Navigating menopause. Bothered by hot flashes (increasing) leading to poor sleep. Had some brain fog. She is on some medications to help with symptoms (Adderral, Prozac), providing some help. A couple of weeks ago developed right pelvic pain almost like right ovarian pain (after increased dose of Prozac). Better this week (doesn't feel it unless thinks about it). Got tested for UTI (negative). Endorses relatively normal bowel function.  Past Medical/Surgical History: Past Medical History:  Diagnosis Date   Anemia    Depression    Dyspareunia in female 03/26/2016   ESBL E. coli carrier 05/20/2016   Headache    Migraines   History of postpartum depression    Lightheadedness 03/26/2016   Myalgia 03/26/2016   Pneumonia    Vaginal pruritus 03/26/2016    Past Surgical History:  Procedure Laterality Date   ABDOMINAL HYSTERECTOMY     CESAREAN SECTION     x3   compound fracture Right    Compound Fracture Right arm   CYSTOSCOPY N/A 09/27/2019   Procedure: CYSTOSCOPY;  Surgeon: Jaymes Graff, MD;  Location: [facility];  Service: Gynecology;  Laterality: N/A;   DILATION AND CURETTAGE OF UTERUS     DILITATION & CURRETTAGE/HYSTROSCOPY WITH HYDROTHERMAL ABLATION N/A 10/10/2015   Procedure: DILATATION & CURETTAGE/HYSTEROSCOPY WITH HYDROTHERMAL ABLATION;  Surgeon: Jaymes Graff, MD;  Location: [facility];  Service: Gynecology;  Laterality: N/A;   KNEE ARTHROSCOPY     LAPAROSCOPIC SALPINGO OOPHERECTOMY N/A 09/02/2022   Procedure: LAPAROSCOPIC SALPINGO OOPHORECTOMY;  Surgeon: Jaymes Graff, MD;  Location: [facility];  Service: Gynecology;  Laterality: N/A;   LAPAROSCOPIC VAGINAL HYSTERECTOMY WITH SALPINGECTOMY  Bilateral 09/27/2019   Procedure: LAPAROSCOPIC CONVERTED TO OPEN ABDOMINAL HYSTERECTOMY WITH  LEFT SALPINGECTOMY AND EXTENSIVE LYSIS OF ADHESIONS;  Surgeon: Jaymes Graff, MD;  Location: [facility];  Service: Gynecology;  Laterality: Bilateral;   LAPAROTOMY N/A 09/02/2022   Procedure: POSSIBLE EXPLORATORY LAPAROTOMY;  Surgeon: Jaymes Graff, MD;  Location: [facility];  Service: Gynecology;  Laterality: N/A;   SHOULDER ARTHROSCOPY     TUBAL LIGATION     pptl   WISDOM TOOTH EXTRACTION      Family History  Problem Relation Age of Onset   Alzheimer's disease Mother    Cancer Mother        unk type   Pancreatic cancer Father 35   Alzheimer's disease Maternal Grandfather    Cancer Other        pat great aunts x2 - unk type   Kidney nephrosis Neg Hx    Colon cancer Neg Hx    Breast cancer Neg Hx    Ovarian cancer Neg Hx    Endometrial cancer Neg Hx     Social History   Socioeconomic History   Marital status: Married    Spouse name: Not on file   Number of children: Not on file   Years of education: Not on file   Highest education level: Not on file  Occupational History   Not on file  Tobacco Use   Smoking status: Never  Smokeless tobacco: Never  Vaping Use   Vaping Use: Never used  Substance and Sexual Activity   Alcohol use: No   Drug use: No   Sexual activity: Yes  Other Topics Concern   Not on file  Social History Narrative   Not on file   Social Determinants of Health   Financial Resource Strain: Not on file  Food Insecurity: Not on file  Transportation Needs: Not on file  Physical Activity: Not on file  Stress: Not on file  Social Connections: Not on file    Current Medications:  Current Outpatient Medications:    Cholecalciferol (VITAMIN D3) 25 MCG (1000 UT) CHEW, Chew 2,000 Units by mouth 2 (two) times daily., Disp: , Rfl:    cyanocobalamin (VITAMIN B12) 1000 MCG tablet, Take 1,000 mcg by mouth daily., Disp: , Rfl:     HYDROcodone-acetaminophen (NORCO/VICODIN) 5-325 MG tablet, Take 1 tablet by mouth every 6 (six) hours as needed for severe pain., Disp: 30 tablet, Rfl: 0   naproxen sodium (ALEVE) 220 MG tablet, Take 880 mg by mouth 2 (two) times daily as needed (migraines/headaches)., Disp: , Rfl:   Review of Symptoms: Pertinent positives as per HPI.  Physical Exam: Deferred given limitations of phone visit.  Laboratory & Radiologic Studies: None new  Assessment & Plan: Kim Gonzales is a 52 y.o. woman with history of uterine fibroids and solid-appearing right pelvic mass abutting versus involving the bladder.   Patient previously was scheduled for surgery with me, urology back-up. She met with Dr. Marlou Porch and ultimately decided to wait on surgery. Several weeks ago, she developed right pelvic pain as if she was having ovarian pain. This pain has improved this week. Encouraged her to get repeat UA in 2-3 months since hgb seen on recent UA. Given proximity of the mass to the bladder, hgb/pain may be related. We discussed getting repeat imaging to assess known pelvic mass if she were to have worsening symptoms again. This would be best done with an MRI.   I discussed the assessment and treatment plan with the patient. The patient was provided with an opportunity to ask questions and all were answered. The patient agreed with the plan and demonstrated an understanding of the instructions.   The patient was advised to call back or see an in-person evaluation if the symptoms worsen or if the condition fails to improve as anticipated.   8 minutes of total time was spent for this patient encounter, including preparation, phone counseling with the patient and coordination of care, and documentation of the encounter.   Eugene Garnet, MD  Division of Gynecologic Oncology  Department of Obstetrics and Gynecology  [facility]

## 2023-07-30 ENCOUNTER — Telehealth: Payer: Self-pay

## 2023-07-30 NOTE — Telephone Encounter (Signed)
Pt called office this morning stating she is getting reminder calls about a lab that is scheduled for Monday 10/7. She is not aware of what that lab is for and cancelled at this time.

## 2023-08-02 ENCOUNTER — Inpatient Hospital Stay: Payer: 59

## 2023-09-21 ENCOUNTER — Telehealth: Payer: Self-pay

## 2023-09-21 DIAGNOSIS — R102 Pelvic and perineal pain: Secondary | ICD-10-CM

## 2023-09-21 DIAGNOSIS — R1900 Intra-abdominal and pelvic swelling, mass and lump, unspecified site: Secondary | ICD-10-CM

## 2023-09-21 NOTE — Telephone Encounter (Signed)
Kim Gonzales called stating she recently saw Dr.Tucker in the spring. Dr.Tucker mentioned scheduling an MRI of her bladder if she was having concerns. She states she has not had any UTI symptoms (pain,pressure, burning or frequency) however, today she was standing letting her dogs out and urinated on herself. She said once it started she couldn't stop it. 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 (Fax number given for results to be faxed to [place])  They are sending it for a culture. Dr.Merriel (sp?) told her she should go ahead and get the MRI.   She states financially she can pay for the MRI but if a follow up is needed with Dr. Pricilla Holm, she would like that to be in January.   Aware Dr.Tucker is out of the office today, message sent and will call her with advice from Dr.Tucker.

## 2023-09-22 NOTE — Telephone Encounter (Signed)
MRI scheduled for 12/4 @ 12:30. Pt aware and agrees to date/time

## 2023-09-29 ENCOUNTER — Ambulatory Visit (HOSPITAL_COMMUNITY)
Admission: RE | Admit: 2023-09-29 | Discharge: 2023-09-29 | Disposition: A | Discharge status: Home / Self Care | Payer: 59 | Source: Ambulatory Visit | Attending: Gynecologic Oncology | Admitting: Gynecologic Oncology

## 2023-09-29 DIAGNOSIS — R102 Pelvic and perineal pain: Secondary | ICD-10-CM | POA: Insufficient documentation

## 2023-09-29 DIAGNOSIS — R1900 Intra-abdominal and pelvic swelling, mass and lump, unspecified site: Secondary | ICD-10-CM | POA: Diagnosis present

## 2023-09-29 MED ORDER — GADOBUTROL 1 MMOL/ML IV SOLN
7.0000 mL | Freq: Once | INTRAVENOUS | Status: AC | PRN
  Administered 2023-09-29: 7 mL via INTRAVENOUS

## 2023-10-06 ENCOUNTER — Telehealth: Payer: Self-pay | Admitting: Gynecologic Oncology

## 2023-10-06 NOTE — Telephone Encounter (Signed)
Called patient with MR results. Right ovary vs adnexal mass (suspected to be endometriosis) is nearly half of the size that it was in 2023. She had blood in her urine in November when she saw her PCP. Otherwise, she is asymptomatic. Discussed calling PCP to have urine retested. If there is still blood in her urine, I would recommend that she reach out to Dr. Jasmine Awe office (who she saw previously) for further evaluation.  Eugene Garnet MD Gynecologic Oncology
